# Patient Record
Sex: Male | Born: 1965
Health system: Southern US, Community
[De-identification: ages and names within clinical notes are randomized; demographics above are authoritative.]

## PROBLEM LIST (undated history)

## (undated) HISTORY — PX: NO PAST SURGERIES: SHX2092

---

## 2012-02-29 ENCOUNTER — Ambulatory Visit: Payer: Self-pay | Admitting: Internal Medicine

## 2019-11-12 ENCOUNTER — Encounter: Payer: Self-pay | Admitting: Emergency Medicine

## 2019-11-12 ENCOUNTER — Emergency Department
Admission: EM | Admit: 2019-11-12 | Discharge: 2019-11-12 | Disposition: A | Discharge status: Home / Self Care | Payer: BC Managed Care – PPO | Attending: Student | Admitting: Student

## 2019-11-12 ENCOUNTER — Emergency Department: Payer: BC Managed Care – PPO

## 2019-11-12 ENCOUNTER — Other Ambulatory Visit: Payer: Self-pay

## 2019-11-12 ENCOUNTER — Ambulatory Visit (INDEPENDENT_AMBULATORY_CARE_PROVIDER_SITE_OTHER)
Admission: EM | Admit: 2019-11-12 | Discharge: 2019-11-12 | Disposition: A | Discharge status: Home / Self Care | Payer: BC Managed Care – PPO | Source: Home / Self Care | Attending: Emergency Medicine | Admitting: Emergency Medicine

## 2019-11-12 DIAGNOSIS — G510 Bell's palsy: Secondary | ICD-10-CM | POA: Insufficient documentation

## 2019-11-12 DIAGNOSIS — H7292 Unspecified perforation of tympanic membrane, left ear: Secondary | ICD-10-CM | POA: Insufficient documentation

## 2019-11-12 DIAGNOSIS — H60392 Other infective otitis externa, left ear: Secondary | ICD-10-CM | POA: Diagnosis not present

## 2019-11-12 DIAGNOSIS — R29810 Facial weakness: Secondary | ICD-10-CM | POA: Diagnosis not present

## 2019-11-12 LAB — BASIC METABOLIC PANEL
Anion gap: 9 (ref 5–15)
BUN: 14 mg/dL (ref 6–20)
CO2: 22 mmol/L (ref 22–32)
Calcium: 8.7 mg/dL — ABNORMAL LOW (ref 8.9–10.3)
Chloride: 104 mmol/L (ref 98–111)
Creatinine, Ser: 0.82 mg/dL (ref 0.61–1.24)
GFR calc Af Amer: >60 mL/min (ref >60)
GFR calc non Af Amer: >60 mL/min (ref >60)
Glucose, Bld: 207 mg/dL — ABNORMAL HIGH (ref 70–99)
Potassium: 3.6 mmol/L (ref 3.5–5.1)
Sodium: 135 mmol/L (ref 135–145)

## 2019-11-12 LAB — CBC
HCT: 43.4 % (ref 39.0–52.0)
Hemoglobin: 13.6 g/dL (ref 13.0–17.0)
MCH: 22.3 pg — ABNORMAL LOW (ref 26.0–34.0)
MCHC: 31.3 g/dL (ref 30.0–36.0)
MCV: 71.1 fL — ABNORMAL LOW (ref 80.0–100.0)
Platelets: 277 10*3/uL (ref 150–400)
RBC: 6.1 MIL/uL — ABNORMAL HIGH (ref 4.22–5.81)
RDW: 14.7 % (ref 11.5–15.5)
WBC: 5 10*3/uL (ref 4.0–10.5)
nRBC: 0 % (ref 0.0–0.2)

## 2019-11-12 MED ORDER — PREDNISONE 20 MG PO TABS: 60.0000 mg | ORAL_TABLET | Freq: Every day | ORAL | 0 refills | Status: AC

## 2019-11-12 MED ORDER — CIPROFLOXACIN-DEXAMETHASONE 0.3-0.1 % OT SUSP: 3.0000 [drp] | Freq: Two times a day (BID) | OTIC | 0 refills | Status: AC

## 2019-11-12 MED ORDER — VALACYCLOVIR HCL 1 G PO TABS: 1000.0000 mg | ORAL_TABLET | Freq: Three times a day (TID) | ORAL | 0 refills | Status: AC

## 2019-11-12 NOTE — ED Triage Notes (Signed)
FIRST NURSE NOTE-here for left facial numbness and tingling.  Sent from urgent care for CVA/bells palsy work up.  No weakness noted. VAN negative.  Sx started yesterday afternoon.

## 2019-11-12 NOTE — ED Notes (Signed)
Pt brought back to room 19. Was seen at urgent care and sent over for facial numbness - unable to blink left eye or raise left eyebrow. Blood has resulted and pt had his ct. Also talked to pt regarding needing a pcp as his bs was 207 and he is not diagnosed with diabetes.

## 2019-11-12 NOTE — ED Triage Notes (Addendum)
Pt arrived via POV from [facility] with reports of left side facial droop and numbness since yesterday around 3pm.  Per notes from [facility], [place] is suspected.  Pt is unable to close left eye. Pt states when he was brushing his teeth today water was coming out of his mouth.  Speech clear. Denies any numbness or tingling to arms or legs, ambulatory with steady gait.  Pt reports mild headache on the left as well. Denies blurred vision.

## 2019-11-12 NOTE — Discharge Instructions (Signed)
Thank you for letting us take care of you in the emergency department today.   New medications we have prescribed:  - Ciprodex - ear drops for the LEFT ear, for your ear drum rupture - Prednisone - steroids for the Bell's palsy - Valacyclovir - antiviral for the Bell's palsy  Use artifical tears (over the counter Visine) every hour while awake. Tape your eye shut at night for protection. At work be sure to wear protective glasses or goggles.   Please follow up with: - A primary care doctor to review your ER visit and follow up on your symptoms. We have given you information for one below. They should recheck your sugar at this visit.  - An ENT doctor - to follow up on your ear drum - An eye doctor - for monitoring of your eye due to the Bell's palsy  Information for all 3 of these doctors is below.  Please return to the ER for any new or worsening symptoms.

## 2019-11-12 NOTE — ED Provider Notes (Signed)
[facility] Emergency Department Provider Note  ____________________________________________   First MD Initiated Contact with Patient 11/12/19 1216     (approximate)  I have reviewed the triage vital signs and the nursing notes.  History  Chief Complaint Facial Droop and Numbness    HPI Roger Gamble is a 53 y.o. male w/ no significant medical history who presents for left sided facial droop, inability to close his eye on the L, inability to raise forehead on left, as well as decreased sensation to the left sided face. Symptoms started yesterday afternoon. He has no associated headache, visual changes, slurred speech, or extremity weakness, numbness, tingling. No history of similar symptoms. No tick exposure.   Aside, he also reports ~1-2 weeks of left ear pain. He has been attempting to clean out his ears with solution and with bulb suctioning. He denies any drainage or decreased hearing.    Past Medical Hx History reviewed. No pertinent past medical history.  Problem List There are no active problems to display for this patient.   Past Surgical Hx History reviewed. No pertinent surgical history.  Medications Prior to Admission medications   Not on File    Allergies Patient has no known allergies.  Family Hx Family History  Problem Relation Age of Onset  . Healthy Mother   . Cirrhosis Father     Social Hx Social History   Tobacco Use  . Smoking status: Never Smoker  . Smokeless tobacco: Never Used  Substance Use Topics  . Alcohol use: Yes  . Drug use: Not on file     Review of Systems  Constitutional: Negative for fever, chills. Eyes: Negative for visual changes. ENT: Negative for sore throat. Cardiovascular: Negative for chest pain. Respiratory: Negative for shortness of breath. Gastrointestinal: Negative for nausea, vomiting.  Genitourinary: Negative for dysuria. Musculoskeletal: Negative for leg swelling. Skin:  Negative for rash. Neurological: + facial droop   Physical Exam  Vital Signs: ED Triage Vitals  Enc Vitals Group     BP 11/12/19 1118 (!) 156/97     Pulse Rate 11/12/19 1118 67     Resp 11/12/19 1118 16     Temp 11/12/19 1118 99.1 F (37.3 C)     Temp Source 11/12/19 1118 Oral     SpO2 11/12/19 1118 97 %     Weight 11/12/19 1129 204 lb (92.5 kg)     Height 11/12/19 1129 5\' 9"  (1.753 m)     Head Circumference --      Peak Flow --      Pain Score 11/12/19 1128 6     Pain Loc --      Pain Edu? --      Excl. in [place]? --     Constitutional: Alert and oriented.  Head: Left sided facial droop, including inability to raise eyebrow on left. Unable to close left eye.  Eyes: Conjunctivae clear. Sclera anicteric. PERRL. EOMI. Cannot close eye on left.  Ears: R TM clear, normal light reflex, no effusion. L TM perforated with erythema and irritation of the EAC. Nose: No congestion. No rhinorrhea. Mouth/Throat: Wearing mask.  Neck: No stridor.   Cardiovascular: Normal rate, regular rhythm. Extremities well perfused. Respiratory: Normal respiratory effort.  Lungs CTAB. Gastrointestinal: Soft. Non-tender. Non-distended.  Musculoskeletal: No lower extremity edema. No deformities. Neurologic:  Normal speech and language. No dysarthria. Left sided CN VII palsy, including inability to forehead raise or close eye on the left. Decreased sensation to L sided face  compared to R. UE and LE strength 5/5 and symmetric. SILT. Skin: Skin is warm, dry and intact. No rash noted. Psychiatric: Mood and affect are appropriate for situation.  EKG  N/A    Radiology  CT head: IMPRESSION:  No CT evidence of acute infarction. No acute intracranial findings.    Procedures  Procedure(s) performed (including critical care):  Procedures   Initial Impression / Assessment and Plan / ED Course  53 y.o. male who presents to the ED for left sided facial droop, CN VII palsy, as above. Symptoms since  yesterday afternoon.  Exam consistent with Bell's palsy. CT head negative. Labs w/o actionable derangements (incidentally noted hyperglycemia w/o evidence of DKA - advised PCP establishment and follow up). Exam also notable for perforated TM on left and otitis externa.   Will provide Rx for antiviral, prednisone for Bell's as well as f/u with ophthalmology. Discussed eye protection, lubrication, nightly taping.   Rx for Ciprodex drops for ear and ENT follow up.   Follow up with PCP as above. Patient and family comfortable w/ plan. Discussed return precautions.    Final Clinical Impression(s) / ED Diagnosis  Final diagnoses:  Bell's palsy  Tympanic membrane rupture, left  Acute infective otitis externa, left       Note:  This document was prepared using Dragon voice recognition software and may include unintentional dictation errors.   Lilia Pro., MD 11/12/19 [phone]

## 2019-11-12 NOTE — Discharge Instructions (Addendum)
Go directly to emergency room.  

## 2019-11-12 NOTE — ED Triage Notes (Signed)
Patient c/o left sided facial swelling, droop and numbness that started yesterday.  Patient c/o left ear pain for the past 2-3 days.  Patient denies fevers.

## 2019-11-12 NOTE — ED Provider Notes (Signed)
[facility] ____________________________________________  Time seen: Approximately 10:26 AM  I have reviewed the triage vital signs and the nursing notes.   HISTORY  Chief Complaint Facial Swelling (left side), Numbness (left side), and Facial Droop (left side)  HPI Roger Gamble is a 53 y.o. male presenting for evaluation of left facial droop and left facial numbness.  Reports this started yesterday afternoon, but reports the numbness continued to progress through the night last night.  Denies any acute changes since last night.  States this morning while brushing his teeth the water was coming out of the mouth on the left side prompted him to come in.  States some left-sided headache and ear discomfort.  Denies any injury, rash, vision changes, vision loss, confusion, paresthesias, unilateral weakness syncope or near syncope.  No chest pain, shortness of breath or recent sickness.  Denies any history of similar.  Denies family history of stroke or cardiac disease.  Denies smoking.   History reviewed. No pertinent past medical history. Denies  There are no active problems to display for this patient.   History reviewed. No pertinent surgical history.   No current facility-administered medications for this encounter.  No current outpatient medications on file.  Allergies Patient has no known allergies.  Family History  Problem Relation Age of Onset  . Healthy Mother   . Cirrhosis Father     Social History Social History   Tobacco Use  . Smoking status: Never Smoker  . Smokeless tobacco: Never Used  Substance Use Topics  . Alcohol use: Yes  . Drug use: Not on file    Review of Systems Constitutional: No fever/chills Eyes: No visual changes.  Reports tearing from bilateral eyes. ENT: No sore throat. Cardiovascular: Denies chest pain. Respiratory: Denies shortness of breath. Gastrointestinal: No abdominal pain.   Genitourinary: Negative for dysuria.  Musculoskeletal: Negative for back pain. Skin: Negative for rash. Neurological: Positive for facial numbness, droop and headache.  Negative for confusion, paresthesias or unsteady gait.  ____________________________________________   PHYSICAL EXAM:  VITAL SIGNS: ED Triage Vitals  Enc Vitals Group     BP 11/12/19 1002 (!) 146/96     Pulse Rate 11/12/19 1002 66     Resp 11/12/19 1002 16     Temp 11/12/19 1002 98.6 F (37 C)     Temp Source 11/12/19 1002 Oral     SpO2 11/12/19 1002 99 %     Weight 11/12/19 1002 204 lb (92.5 kg)     Height 11/12/19 1002 5\' 9"  (1.753 m)     Head Circumference --      Peak Flow --      Pain Score 11/12/19 1001 6     Pain Loc --      Pain Edu? --      Excl. in [place]? --     Constitutional: Alert and oriented. Well appearing and in no acute distress. Eyes: Conjunctivae are normal. PERRL. EOMI. difficulty fully closing left eye. ENT      Head: Normocephalic and atraumatic.      Ears: Nontender, normal canal, no rash, no erythema, normal TMs bilaterally.      Nose: No congestion/rhinnorhea.      Mouth/Throat: Mucous membranes are moist.Oropharynx non-erythematous. Neck: No stridor. Supple without meningismus.  Hematological/Lymphatic/Immunilogical: No cervical lymphadenopathy. Cardiovascular: Normal rate, regular rhythm. Grossly normal heart sounds.  Good peripheral circulation. Respiratory: Normal respiratory effort without tachypnea nor retractions. Breath sounds are clear and equal bilaterally. No wheezes, rales, rhonchi. Musculoskeletal:  Steady gait Neurologic: Mild left facial droop, mild eye lid lag to left eye.  Paresthesias to left forehead cheek and upper neck.  No other paresthesia noted.  No gait instability.5/5 strength to bilateral upper and lower extremities.  No ataxia.  Negative pronator drift. Skin:  Skin is warm, dry and intact. No rash noted. Psychiatric: Mood and affect are normal. Speech and behavior are normal. Patient exhibits  appropriate insight and judgment   ___________________________________________   LABS (all labs ordered are listed, but only abnormal results are displayed)  Labs Reviewed - No data to display ____________________________________________  EKG  ED ECG REPORT I, Renford Dills, the attending provider, personally viewed and interpreted this ECG.   Date: 11/12/2019  EKG Time: 1019  Rate: 60  Rhythm: Normal sinus rhythm  Axis: Normal  Intervals:none  ST&T Change: none  RADIOLOGY  No results found. ____________________________________________   PROCEDURES Procedures    INITIAL IMPRESSION / ASSESSMENT AND PLAN / ED COURSE  Pertinent labs & imaging results that were available during my care of the patient were reviewed by me and considered in my medical decision making (see chart for details).  Alert and oriented.  Well-appearing patient.  Acute onset of left facial numbness and droop yesterday afternoon around 3 PM.  Discussed multiple differentials with patient including CVA, TIA and Bell's palsy. Suspect Bell's palsy.  However recommend further evaluation emergency room at this time.  Patient states his wife will take him directly to [facility].  Patient stable at discharge.  ____________________________________________   FINAL CLINICAL IMPRESSION(S) / ED DIAGNOSES  Final diagnoses:  Facial droop     ED Discharge Orders    None       Note: This dictation was prepared with Dragon dictation along with smaller phrase technology. Any transcriptional errors that result from this process are unintentional.         Renford Dills, NP 11/12/19 1052

## 2019-11-28 DIAGNOSIS — H9202 Otalgia, left ear: Secondary | ICD-10-CM | POA: Diagnosis not present

## 2019-11-28 DIAGNOSIS — G510 Bell's palsy: Secondary | ICD-10-CM | POA: Diagnosis not present

## 2019-12-05 NOTE — Progress Notes (Signed)
Patient: Roger Gamble, Male    DOB: [birth year], 53 y.o.   MRN: [id number] Visit Date: 12/05/2019  Today's Provider: Jairo Ben, FNP   Chief Complaint  Patient presents with  . Establish Care   Subjective:    New Patient Roger Gamble is a 53 y.o. male who presents today for health maintenance and establish care. He feels fairly well, patient states that he would like to discuss today recent ED visit 11/12/19, patient states that he was diagnosed with bells palsy. Patient states that when he was in ED he was told that his blood sugar reading was >200 and patient would like to discuss . He reports he is not exercising . He reports he is sleeping well.  Patient is recovering from Bell's palsy, he reports he is taking day 7 of prednisone today.  He has had return of his forehead movement, and able to raise his forehead and close his left eye at this time as he was not previously able to in the emergency room.  CT scan in the emergency room was negative on 11/12/2019.  He was also diagnosed with a left perforated tympanic membrane after history of cleaning his ears out at home.  He denies any hearing loss or pain at today's visit.  He is here to establish care, we will do labs today and follow-up in 1 month for further discussion on lab results and healthcare health maintenance.  He is advised that he is due for colonoscopy. Sees dentist regularly.  No eye exam.  Patient  denies any fever, body aches,chills, rash, chest pain, shortness of breath, nausea, vomiting, or diarrhea.  He denies any rash, he denies any other concerns at today's visit.  -----------------------------------------------------------------   Review of Systems  Constitutional: Negative.   HENT: Negative.   Respiratory: Negative.   Cardiovascular: Negative.   Gastrointestinal: Negative.   Genitourinary: Negative.   Musculoskeletal: Negative.   Skin: Negative.   Neurological: Positive for  numbness. Negative for dizziness, tremors, seizures, syncope, facial asymmetry, speech difficulty, weakness, light-headedness and headaches.  Hematological: Negative for adenopathy. Does not bruise/bleed easily.  Psychiatric/Behavioral: Negative.   All other systems reviewed and are negative.   Social History He  reports that he has never smoked. He has never used smokeless tobacco. He reports current alcohol use. Social History   Socioeconomic History  . Marital status: Married    Spouse name: Not on file  . Number of children: Not on file  . Years of education: Not on file  . Highest education level: Not on file  Occupational History  . Not on file  Tobacco Use  . Smoking status: Never Smoker  . Smokeless tobacco: Never Used  Substance and Sexual Activity  . Alcohol use: Yes  . Drug use: Not on file  . Sexual activity: Not on file  Other Topics Concern  . Not on file  Social History Narrative  . Not on file   Social Determinants of Health   Financial Resource Strain:   . Difficulty of Paying Living Expenses: Not on file  Food Insecurity:   . Worried About Programme researcher, broadcasting/film/video in the Last Year: Not on file  . Ran Out of Food in the Last Year: Not on file  Transportation Needs:   . Lack of Transportation (Medical): Not on file  . Lack of Transportation (Non-Medical): Not on file  Physical Activity:   . Days of Exercise per Week: Not on  file  . Minutes of Exercise per Session: Not on file  Stress:   . Feeling of Stress : Not on file  Social Connections:   . Frequency of Communication with Friends and Family: Not on file  . Frequency of Social Gatherings with Friends and Family: Not on file  . Attends Religious Services: Not on file  . Active Member of Clubs or Organizations: Not on file  . Attends Archivist Meetings: Not on file  . Marital Status: Not on file    There are no problems to display for this patient.   No past surgical history on  file.  Family History  Family Status  Relation Name Status  . Mother  Alive  . Father  Deceased   His family history includes Cirrhosis in his father; Healthy in his mother.     No Known Allergies  Previous Medications   No medications on file    Patient Care Team: Patient, No Pcp Per as PCP - General (General Practice)      Objective:   Vitals: There were no vitals taken for this visit.   Physical Exam Vitals and nursing note reviewed.  Constitutional:      General: He is not in acute distress.    Appearance: Normal appearance. He is well-developed. He is not ill-appearing, toxic-appearing or diaphoretic.     Comments: Patient is alert and oriented and responsive to questions Engages in eye contact with provider. Speaks in full sentences without any pauses without any shortness of breath or distress.    HENT:     Head: Normocephalic and atraumatic.     Jaw: There is normal jaw occlusion.     Salivary Glands: Right salivary gland is not diffusely enlarged. Left salivary gland is not diffusely enlarged.     Comments: bilateral forehead movement is normal. Able to close and open bilateral eyes.     Right Ear: Hearing, tympanic membrane, ear canal and external ear normal.     Left Ear: Hearing, tympanic membrane, ear canal and external ear normal.     Nose: Nose normal.     Mouth/Throat:     Mouth: Mucous membranes are moist.     Pharynx: Uvula midline. No oropharyngeal exudate or posterior oropharyngeal erythema.  Eyes:     General: Lids are normal. Lids are everted, no foreign bodies appreciated. Gaze aligned appropriately. No visual field deficit or scleral icterus.       Right eye: No discharge.        Left eye: No discharge.     Extraocular Movements: Extraocular movements intact.     Conjunctiva/sclera: Conjunctivae normal.     Pupils: Pupils are equal, round, and reactive to light.  Neck:     Thyroid: No thyromegaly.     Vascular: Normal carotid pulses. No  carotid bruit, hepatojugular reflux or JVD.     Trachea: Trachea and phonation normal. No tracheal tenderness or tracheal deviation.     Meningeal: Brudzinski's sign absent.  Cardiovascular:     Rate and Rhythm: Normal rate and regular rhythm.     Pulses: Normal pulses.     Heart sounds: Normal heart sounds, S1 normal and S2 normal. Heart sounds not distant. No murmur. No friction rub. No gallop.   Pulmonary:     Effort: Pulmonary effort is normal. No accessory muscle usage or respiratory distress.     Breath sounds: Normal breath sounds. No stridor. No wheezing or rales.  Chest:  Chest wall: No tenderness.  Abdominal:     General: Bowel sounds are normal. There is no distension.     Palpations: Abdomen is soft. There is no mass.     Tenderness: There is no abdominal tenderness. There is no guarding or rebound.     Hernia: No hernia is present.  Genitourinary:    Comments: deferred by patient.  Musculoskeletal:        General: No tenderness or deformity. Normal range of motion.     Cervical back: Full passive range of motion without pain, normal range of motion and neck supple.     Comments: Patient moves on and off of exam table and in room without difficulty. Gait is normal in hall and in room. Patient is oriented to person place time and situation. Patient answers questions appropriately and engages in conversation.   Lymphadenopathy:     Head:     Right side of head: No submental, submandibular, tonsillar, preauricular, posterior auricular or occipital adenopathy.     Left side of head: No submental, submandibular, tonsillar, preauricular, posterior auricular or occipital adenopathy.     Cervical: No cervical adenopathy.  Skin:    General: Skin is warm and dry.     Capillary Refill: Capillary refill takes less than 2 seconds.     Coloration: Skin is not pale.     Findings: No erythema ( ) or rash.     Nails: There is no clubbing.  Neurological:     Mental Status: He is  alert and oriented to person, place, and time.     GCS: GCS eye subscore is 4. GCS verbal subscore is 5. GCS motor subscore is 6.     Cranial Nerves: No cranial nerve deficit or facial asymmetry.     Sensory: Sensory deficit (mild decrease in sensation to left side when compared to right side( patient reports much improved since ER visit) ) present.     Motor: Motor function is intact. No abnormal muscle tone.     Coordination: Coordination is intact. Coordination normal.     Gait: Gait is intact. Gait normal.     Deep Tendon Reflexes: Reflexes are normal and symmetric. Reflexes normal.     Reflex Scores:      Tricep reflexes are 2+ on the right side and 2+ on the left side.      Bicep reflexes are 2+ on the right side and 2+ on the left side.      Patellar reflexes are 2+ on the right side and 2+ on the left side. Psychiatric:        Attention and Perception: Attention and perception normal.        Mood and Affect: Mood and affect normal.        Speech: Speech normal.        Behavior: Behavior normal. Behavior is cooperative.        Thought Content: Thought content normal.        Cognition and Memory: Cognition normal.        Judgment: Judgment normal.      Depression Screen No flowsheet data found.    Assessment & Plan:     Routine Health Maintenance and Physical Exam  Exercise Activities and Dietary recommendations Goals   None      There is no immunization history on file for this patient.  There are no preventive care reminders to display for this patient.   Discussed health benefits of physical activity, and  encouraged him to engage in regular exercise appropriate for his age and condition.     1. Bell's palsy Continue current treatment plan. Discussed RED flags and when to seek immediate care.   - CBC with Differential/Platelet 005009 - Comprehensive Metabolic Panel (CMET) - VITAMIN D 25 Hydroxy (Vit-D Deficiency, Fractures) - TSH - PSA - HgB A1c -  Ambulatory referral to Gastroenterology  2. Routine health maintenance Labs today. Follow up in one month.  - CBC with Differential/Platelet 005009 - Comprehensive Metabolic Panel (CMET) - VITAMIN D 25 Hydroxy (Vit-D Deficiency, Fractures) - TSH - PSA - HgB A1c - Ambulatory referral to Gastroenterology  3. Blood glucose elevated Elevated in ER , had eaten prior. Will verify fasting today and A1C.  - CBC with Differential/Platelet 005009 - Comprehensive Metabolic Panel (CMET) - VITAMIN D 25 Hydroxy (Vit-D Deficiency, Fractures) - TSH - PSA - HgB A1c - Ambulatory referral to Gastroenterology- For Colonoscopy. No previous.    4. BMI 30.99  Discussed healthy lifestyle/ diet/ exercise.   Declines HIV screening, TDAP and Influenza at today's visit.   Will call with lab results.  Return to office sooner should any symptoms change or worsen.  Return in about 1 month (around 01/06/2020), or if symptoms worsen or fail to improve, for at any time for any worsening symptoms, Go to Emergency room/ urgent care if worse.  -------------------------------------------------------------------- .The entirety of the information documented in the History of Present Illness, Review of Systems and Physical Exam were personally obtained by me. Portions of this information were initially documented by the  Certified Medical Assistant whose name is documented in Epic and reviewed by me for thoroughness and accuracy.  I have personally performed the exam and reviewed the chart and it is accurate to the best of my knowledge.  Museum/gallery conservatorDragon technology has been used and any errors in dictation or transcription are unintentional.  Eula FriedMichelle S. Flinchum FNP-C  [facility]

## 2019-12-06 ENCOUNTER — Other Ambulatory Visit: Payer: Self-pay

## 2019-12-06 ENCOUNTER — Ambulatory Visit: Payer: BC Managed Care – PPO | Admitting: Adult Health

## 2019-12-06 ENCOUNTER — Encounter: Payer: Self-pay | Admitting: Adult Health

## 2019-12-06 VITALS — BP 126/80 | HR 70 | Temp 96.8°F | Resp 16 | Ht 67.5 in | Wt 200.8 lb

## 2019-12-06 DIAGNOSIS — G510 Bell's palsy: Secondary | ICD-10-CM

## 2019-12-06 DIAGNOSIS — R739 Hyperglycemia, unspecified: Secondary | ICD-10-CM | POA: Diagnosis not present

## 2019-12-06 DIAGNOSIS — Z0000 Encounter for general adult medical examination without abnormal findings: Secondary | ICD-10-CM | POA: Diagnosis not present

## 2019-12-06 DIAGNOSIS — R718 Other abnormality of red blood cells: Secondary | ICD-10-CM | POA: Diagnosis not present

## 2019-12-06 DIAGNOSIS — Z6830 Body mass index (BMI) 30.0-30.9, adult: Secondary | ICD-10-CM | POA: Diagnosis not present

## 2019-12-06 NOTE — Patient Instructions (Signed)
Health Maintenance, Male Adopting a healthy lifestyle and getting preventive care are important in promoting health and wellness. Ask your health care provider about:  The right schedule for you to have regular tests and exams.  Things you can do on your own to prevent diseases and keep yourself healthy. What should I know about diet, weight, and exercise? Eat a healthy diet   Eat a diet that includes plenty of vegetables, fruits, low-fat dairy products, and lean protein.  Do not eat a lot of foods that are high in solid fats, added sugars, or sodium. Maintain a healthy weight Body mass index (BMI) is a measurement that can be used to identify possible weight problems. It estimates body fat based on height and weight. Your health care provider can help determine your BMI and help you achieve or maintain a healthy weight. Get regular exercise Get regular exercise. This is one of the most important things you can do for your health. Most adults should:  Exercise for at least 150 minutes each week. The exercise should increase your heart rate and make you sweat (moderate-intensity exercise).  Do strengthening exercises at least twice a week. This is in addition to the moderate-intensity exercise.  Spend less time sitting. Even light physical activity can be beneficial. Watch cholesterol and blood lipids Have your blood tested for lipids and cholesterol at 51 years of age, then have this test every 5 years. You may need to have your cholesterol levels checked more often if:  Your lipid or cholesterol levels are high.  You are older than 51 years of age.  You are at high risk for heart disease. What should I know about cancer screening? Many types of cancers can be detected early and may often be prevented. Depending on your health history and family history, you may need to have cancer screening at various ages. This may include screening for:  Colorectal cancer.  Prostate  cancer.  Skin cancer.  Lung cancer. What should I know about heart disease, diabetes, and high blood pressure? Blood pressure and heart disease  High blood pressure causes heart disease and increases the risk of stroke. This is more likely to develop in people who have high blood pressure readings, are of African descent, or are overweight.  Talk with your health care provider about your target blood pressure readings.  Have your blood pressure checked: ? Every 3-5 years if you are 18-39 years of age. ? Every year if you are 40 years old or older.  If you are between the ages of 65 and 75 and are a current or former smoker, ask your health care provider if you should have a one-time screening for abdominal aortic aneurysm (AAA). Diabetes Have regular diabetes screenings. This checks your fasting blood sugar level. Have the screening done:  Once every three years after age 45 if you are at a normal weight and have a low risk for diabetes.  More often and at a younger age if you are overweight or have a high risk for diabetes. What should I know about preventing infection? Hepatitis B If you have a higher risk for hepatitis B, you should be screened for this virus. Talk with your health care provider to find out if you are at risk for hepatitis B infection. Hepatitis C Blood testing is recommended for:  Everyone born from 1945 through 1965.  Anyone with known risk factors for hepatitis C. Sexually transmitted infections (STIs)  You should be screened each year   for STIs, including gonorrhea and chlamydia, if: ? You are sexually active and are younger than 51 years of age. ? You are older than 51 years of age and your health care provider tells you that you are at risk for this type of infection. ? Your sexual activity has changed since you were last screened, and you are at increased risk for chlamydia or gonorrhea. Ask your health care provider if you are at risk.  Ask your  health care provider about whether you are at high risk for HIV. Your health care provider may recommend a prescription medicine to help prevent HIV infection. If you choose to take medicine to prevent HIV, you should first get tested for HIV. You should then be tested every 3 months for as long as you are taking the medicine. Follow these instructions at home: Lifestyle  Do not use any products that contain nicotine or tobacco, such as cigarettes, e-cigarettes, and chewing tobacco. If you need help quitting, ask your health care provider.  Do not use street drugs.  Do not share needles.  Ask your health care provider for help if you need support or information about quitting drugs. Alcohol use  Do not drink alcohol if your health care provider tells you not to drink.  If you drink alcohol: ? Limit how much you have to 0-2 drinks a day. ? Be aware of how much alcohol is in your drink. In the [place] one drink equals one 12 oz bottle of beer (355 mL), one 5 oz glass of wine (148 mL), or one 1 oz glass of hard liquor (44 mL). General instructions  Schedule regular health, dental, and eye exams.  Stay current with your vaccines.  Tell your health care provider if: ? You often feel depressed. ? You have ever been abused or do not feel safe at home. Summary  Adopting a healthy lifestyle and getting preventive care are important in promoting health and wellness.  Follow your health care provider's instructions about healthy diet, exercising, and getting tested or screened for diseases.  Follow your health care provider's instructions on monitoring your cholesterol and blood pressure. This information is not intended to replace advice given to you by your health care provider. Make sure you discuss any questions you have with your health care provider. Document Released: 05/22/2008 Document Revised: 11/17/2018 Document Reviewed: 11/17/2018 Elsevier Patient Education  2020 [employer] Bell Palsy, Adult  Bell palsy is a short-term inability to move muscles in part of the face. The inability to move (paralysis) results from inflammation or compression of the facial nerve, which travels along the skull and under the ear to the side of the face (7th cranial nerve). This nerve is responsible for facial movements that include blinking, closing the eyes, smiling, and frowning. What are the causes? The exact cause of this condition is not known. It may be caused by an infection from a virus, such as the chickenpox (herpes zoster), Epstein-Barr, or mumps virus. What increases the risk? You are more likely to develop this condition if:  You are pregnant.  You have diabetes.  You have had a recent infection in your nose, throat, or airways (upper respiratory infection).  You have a weakened body defense system (immune system).  You have had a facial injury, such as a fracture.  You have a family history of Bell palsy. What are the signs or symptoms? Symptoms of this condition include:  Weakness on one side of the face.  Drooping eyelid and corner of the mouth.  Excessive tearing in one eye.  Difficulty closing the eyelid.  Dry eye.  Drooling.  Dry mouth.  Changes in taste.  Change in facial appearance.  Pain behind one ear.  Ringing in one or both ears.  Sensitivity to sound in one ear.  Facial twitching.  Headache.  Impaired speech.  Dizziness.  Difficulty eating or drinking. Most of the time, only one side of the face is affected. Rarely, Bell palsy affects the whole face. How is this diagnosed? This condition is diagnosed based on:  Your symptoms.  Your medical history.  A physical exam. You may also have to see health care providers who specialize in disorders of the nerves (neurologist) or diseases and conditions of the eye (ophthalmologist). You may have tests, such as:  A test to check for nerve damage  (electromyogram).  Imaging studies, such as CT or MRI scans.  Blood tests. How is this treated? This condition affects every person differently. Sometimes symptoms go away without treatment within a couple weeks. If treatment is needed, it varies from person to person. The goal of treatment is to reduce inflammation and protect the eye from damage. Treatment for Bell palsy may include:  Medicines, such as: ? Steroids to reduce swelling and inflammation. ? Antiviral drugs. ? Pain relievers, including aspirin, acetaminophen, or ibuprofen.  Eye drops or ointment to keep your eye moist.  Eye protection, if you cannot close your eye.  Exercises or massage to regain muscle strength and function (physical therapy). Follow these instructions at home:   Take over-the-counter and prescription medicines only as told by your health care provider.  If your eye is affected: ? Keep your eye moist with eye drops or ointment as told by your health care provider. ? Follow instructions for eye care and protection as told by your health care provider.  Do any physical therapy exercises as told by your health care provider.  Keep all follow-up visits as told by your health care provider. This is important. Contact a health care provider if:  You have a fever.  Your symptoms do not get better within 2-3 weeks, or your symptoms get worse.  Your eye is red, irritated, or painful.  You have new symptoms. Get help right away if:  You have weakness or numbness in a part of your body other than your face.  You have trouble swallowing.  You develop neck pain or stiffness.  You develop dizziness or shortness of breath. Summary  Bell palsy is a short-term inability to move muscles in part of the face. The inability to move (paralysis) results from inflammation or compression of the facial nerve.  This condition affects every person differently. Sometimes symptoms go away without treatment within  a couple weeks.  If treatment is needed, it varies from person to person. The goal of treatment is to reduce inflammation and protect the eye from damage.  Contact your health care provider if your symptoms do not get better within 2-3 weeks, or your symptoms get worse. This information is not intended to replace advice given to you by your health care provider. Make sure you discuss any questions you have with your health care provider. Document Released: 11/24/2005 Document Revised: 11/06/2017 Document Reviewed: 01/27/2017 Elsevier Patient Education  2020 [employer].

## 2019-12-07 LAB — COMPREHENSIVE METABOLIC PANEL
ALT: 16 IU/L (ref 0–44)
AST: 6 IU/L (ref 0–40)
Albumin/Globulin Ratio: 1.8 (ref 1.2–2.2)
Albumin: 4.5 g/dL (ref 3.8–4.9)
Alkaline Phosphatase: 63 IU/L (ref 39–117)
BUN/Creatinine Ratio: 26 — ABNORMAL HIGH (ref 9–20)
BUN: 20 mg/dL (ref 6–24)
Bilirubin Total: 0.6 mg/dL (ref 0.0–1.2)
CO2: 23 mmol/L (ref 20–29)
Calcium: 9.6 mg/dL (ref 8.7–10.2)
Chloride: 103 mmol/L (ref 96–106)
Creatinine, Ser: 0.77 mg/dL (ref 0.76–1.27)
GFR calc Af Amer: 121 mL/min/{1.73_m2} (ref >59)
GFR calc non Af Amer: 105 mL/min/{1.73_m2} (ref >59)
Globulin, Total: 2.5 g/dL (ref 1.5–4.5)
Glucose: 111 mg/dL — ABNORMAL HIGH (ref 65–99)
Potassium: 4.5 mmol/L (ref 3.5–5.2)
Sodium: 139 mmol/L (ref 134–144)
Total Protein: 7 g/dL (ref 6.0–8.5)

## 2019-12-07 LAB — CBC WITH DIFFERENTIAL/PLATELET
Basophils Absolute: 0 10*3/uL (ref 0.0–0.2)
Basos: 0 %
EOS (ABSOLUTE): 0 10*3/uL (ref 0.0–0.4)
Eos: 0 %
Hematocrit: 43.7 % (ref 37.5–51.0)
Hemoglobin: 13.8 g/dL (ref 13.0–17.7)
Immature Grans (Abs): 0.1 10*3/uL (ref 0.0–0.1)
Immature Granulocytes: 1 %
Lymphocytes Absolute: 1.1 10*3/uL (ref 0.7–3.1)
Lymphs: 13 %
MCH: 22.5 pg — ABNORMAL LOW (ref 26.6–33.0)
MCHC: 31.6 g/dL (ref 31.5–35.7)
MCV: 71 fL — ABNORMAL LOW (ref 79–97)
Monocytes Absolute: 0.4 10*3/uL (ref 0.1–0.9)
Monocytes: 5 %
Neutrophils Absolute: 7.1 10*3/uL — ABNORMAL HIGH (ref 1.4–7.0)
Neutrophils: 81 %
Platelets: 368 10*3/uL (ref 150–450)
RBC: 6.13 x10E6/uL — ABNORMAL HIGH (ref 4.14–5.80)
RDW: 16.4 % — ABNORMAL HIGH (ref 11.6–15.4)
WBC: 8.8 10*3/uL (ref 3.4–10.8)

## 2019-12-07 LAB — PSA: Prostate Specific Ag, Serum: 3.5 ng/mL (ref 0.0–4.0)

## 2019-12-07 LAB — VITAMIN D 25 HYDROXY (VIT D DEFICIENCY, FRACTURES): Vit D, 25-Hydroxy: 9.7 ng/mL — ABNORMAL LOW (ref 30.0–100.0)

## 2019-12-07 LAB — HEMOGLOBIN A1C
Est. average glucose Bld gHb Est-mCnc: 134 mg/dL
Hgb A1c MFr Bld: 6.3 % — ABNORMAL HIGH (ref 4.8–5.6)

## 2019-12-07 LAB — TSH: TSH: 2.34 u[IU]/mL (ref 0.450–4.500)

## 2019-12-08 DIAGNOSIS — G510 Bell's palsy: Secondary | ICD-10-CM | POA: Diagnosis not present

## 2019-12-12 NOTE — Progress Notes (Signed)
Can you check on adding on a TIBC/ iron.  Low MCV diagnosis code.

## 2019-12-12 NOTE — Progress Notes (Signed)
Lab order has been faxed to [facility] to add additional lab. KW

## 2019-12-13 LAB — IRON AND TIBC
Iron Saturation: 32 % (ref 15–55)
Iron: 92 ug/dL (ref 38–169)
Total Iron Binding Capacity: 291 ug/dL (ref 250–450)
UIBC: 199 ug/dL (ref 111–343)

## 2019-12-13 LAB — SPECIMEN STATUS REPORT

## 2019-12-14 NOTE — Progress Notes (Signed)
Please inform patient he has some minor irregularities in his CBC, red blood cells mildly elevated. I would like to recheck the CBC in 2 months to be sure this has returned to normal. This could be related to a virus or possibly the Bells Palsy he had recently. Iron levels are normal.  Fasting glucose was mildly elevated at 111 normal is 65-99. Hemoglobin A1C is elevated at 6.3 putting him in the prediabetes range. If he is interested we can refer to case management for preventing diabetes diet. He should avoid/ strictly limit processed foods, white breads, foods and drinks high in sugar including candy, sweets to avoid diabetes. Increase exercise.  Prostate is normal. Thyroid level is normal.  Vitamin D is very low at 9.7. I will send in prescription to pharmacy of choice vitamin D to take 50,000 IU one tablet  once a week ( or every 7 days) by mouth for 8 weeks. Then he can buy over the counter Vitamin D 3 and take 2,000 international units by mouth daily.   Recheck Vitamin D, Hemoglobin A1C around 4 months.  Recheck CBC in 2 months- also check lipid panel at that time as no results for it.  Let me know if any questions and if he wants the Vitamin D

## 2019-12-16 ENCOUNTER — Encounter: Payer: Self-pay | Admitting: *Deleted

## 2019-12-19 DIAGNOSIS — G510 Bell's palsy: Secondary | ICD-10-CM | POA: Diagnosis not present

## 2019-12-21 ENCOUNTER — Telehealth: Payer: Self-pay | Admitting: Adult Health

## 2019-12-21 ENCOUNTER — Telehealth: Payer: Self-pay

## 2019-12-21 DIAGNOSIS — E559 Vitamin D deficiency, unspecified: Secondary | ICD-10-CM

## 2019-12-21 MED ORDER — VITAMIN D (ERGOCALCIFEROL) 1.25 MG (50000 UNIT) PO CAPS: 50000.0000 [IU] | ORAL_CAPSULE | ORAL | 0 refills | Status: DC

## 2019-12-21 NOTE — Telephone Encounter (Signed)
Please review lab and advise. KW

## 2019-12-21 NOTE — Telephone Encounter (Signed)
Meds ordered this encounter  Medications  . Vitamin D, Ergocalciferol, (DRISDOL) 1.25 MG (50000 UNIT) CAPS capsule    Sig: Take 1 capsule (50,000 Units total) by mouth every 7 (seven) days.    Dispense:  8 capsule    Refill:  0

## 2019-12-21 NOTE — Telephone Encounter (Signed)
Copied from CRM [id number]. Topic: General - Other >> Dec 21, 2019 11:46 AM Jaquita Rector A wrote: Reason for CRM: Patient wife Charlotte Sanes called to say that she was informed that an Rx for Vitamin D to be taken once weekly would had been sent to the pharmacy but upon checking with the pharmacy nothing was called in. Asking Dr Ashok Norris to please send the Rx for the Vitamin D in so that they can pick it up. Any questions please call Ph#   [phone]

## 2019-12-21 NOTE — Telephone Encounter (Signed)
Meds ordered this encounter  Medications   Vitamin D, Ergocalciferol, (DRISDOL) 1.25 MG (50000 UNIT) CAPS capsule    Sig: Take 1 capsule (50,000 Units total) by mouth every 7 (seven) days.    Dispense:  8 capsule    Refill:  0  Sent to pharmacy. After finishing script Vitamin D,start over the counter vitamin d 3 at 4,000 international units daily by mouth. Recheck vitamin D lab in 3 months.

## 2020-01-05 ENCOUNTER — Encounter: Payer: Self-pay | Admitting: Adult Health

## 2020-01-05 NOTE — Progress Notes (Signed)
Patient: Roger Gamble Male    DOB: 01-07-1966   54 y.o.   MRN: [id number] Visit Date: 01/06/2020  Today's Provider: Marcille Buffy, FNP   Chief Complaint  Patient presents with  . Follow-up   Subjective:   Prediabetes:  Diabetes He presents for his initial (pre diabetic teaching / review hemoglobin A1c ) diabetic visit. Diabetes type: PRE DIABETEC  No MedicAlert identification noted. Disease course: WILL RECHECK IN APRIL 2021. There are no hypoglycemic associated symptoms. Pertinent negatives for hypoglycemia include no dizziness, headaches, pallor, seizures, speech difficulty or tremors. Pertinent negatives for diabetes include no blurred vision, no chest pain, no fatigue, no foot paresthesias, no foot ulcerations, no polydipsia, no polyphagia, no polyuria, no visual change, no weakness and no weight loss. There are no hypoglycemic complications. Symptoms are stable. There are no diabetic complications. Risk factors for coronary artery disease include obesity and male sex. Current diabetic treatment includes diet. He is compliant with treatment some of the time. He is following a generally unhealthy diet. Meal planning includes ADA exchanges, carbohydrate counting and avoidance of concentrated sweets. He has not had a previous visit with a dietitian. He rarely participates in exercise. He does not see a podiatrist.Eye exam is not current.    Follow up for  Bells Palsy, pre- diabetic diet education  Patient returns to office today for follow up visit.   Bells Palsy has improved substantially, patient has increased movement in his forehead now he reports and his cheek, he still feels as if he has some numbness near the left side of his mouth and feels as if his smile is off a little when looking in the mirror that has slightly improved since last visit. He denies any worsening of symptoms. No slurred speech, difficulty with activities, new paresthesias, or headaches.  Overall  he feels he is doing much better.  He was initially seen in emergency room on 11/12/2019  CT of head was within normal limits he was discharged and treated with Valtrex and Prednisone 49 pill pack. He reports he has improved since visit with me on 12/05/2020.  He also is doing well with Vitamin D prescription, he was found to be very deficient on his labs at his initial visit with me on 12/05/2020.  Patient  denies any fever, body aches,chills, rash, chest pain, shortness of breath, nausea, vomiting, or diarrhea.   ------------------------------------------------------------------------------------  No Known Allergies   Current Outpatient Medications:  Marland Kitchen  Vitamin D, Ergocalciferol, (DRISDOL) 1.25 MG (50000 UNIT) CAPS capsule, Take 1 capsule (50,000 Units total) by mouth every 7 (seven) days., Disp: 8 capsule, Rfl: 0  Review of Systems  Constitutional: Negative for activity change, appetite change, chills, diaphoresis, fatigue, fever, unexpected weight change and weight loss.  HENT: Negative.   Eyes: Negative.  Negative for blurred vision.  Respiratory: Negative.  Negative for apnea, cough, choking, chest tightness, shortness of breath, wheezing and stridor.   Cardiovascular: Negative.  Negative for chest pain, palpitations and leg swelling.  Endocrine: Negative for polydipsia, polyphagia and polyuria.  Genitourinary: Negative.   Musculoskeletal: Negative.   Skin: Negative for color change, pallor, rash and wound.  Allergic/Immunologic: Negative.   Neurological: Positive for numbness (left side of mouth still feels a little numb since Bells Palsy. ). Negative for dizziness, tremors, seizures, syncope, facial asymmetry, speech difficulty, weakness, light-headedness and headaches.  Hematological: Negative for adenopathy. Does not bruise/bleed easily.  Psychiatric/Behavioral: Negative.     Social History  Tobacco Use  . Smoking status: Never Smoker  . Smokeless tobacco: Never Used    Substance Use Topics  . Alcohol use: Yes      Objective:   BP 116/78   Pulse 62   Temp (!) 96.6 F (35.9 C) (Oral)   Resp 15   Wt 193 lb 3.2 oz (87.6 kg)   SpO2 99%   BMI 29.81 kg/m  Vitals:   01/06/20 0807  BP: 116/78  Pulse: 62  Resp: 15  Temp: (!) 96.6 F (35.9 C)  TempSrc: Oral  SpO2: 99%  Weight: 193 lb 3.2 oz (87.6 kg)  Body mass index is 29.81 kg/m. temporal temperature not oral   Physical Exam Vitals reviewed.  Constitutional:      General: He is not in acute distress.    Appearance: Normal appearance. He is obese. He is not ill-appearing, toxic-appearing or diaphoretic.     Comments: Patient moves on and off of exam table and in room without difficulty. Gait is normal in hall and in room. Patient is oriented to person place time and situation. Patient answers questions appropriately and engages in conversation.   HENT:     Head: Normocephalic and atraumatic.     Right Ear: Tympanic membrane, ear canal and external ear normal. There is no impacted cerumen.     Left Ear: Tympanic membrane, ear canal and external ear normal. There is no impacted cerumen.     Nose: Nose normal.     Mouth/Throat:     Mouth: Mucous membranes are moist.     Pharynx: Oropharynx is clear. No oropharyngeal exudate or posterior oropharyngeal erythema.  Eyes:     General: No visual field deficit or scleral icterus.       Right eye: No discharge.        Left eye: No discharge.     Extraocular Movements: Extraocular movements intact.     Conjunctiva/sclera: Conjunctivae normal.     Pupils: Pupils are equal, round, and reactive to light.  Cardiovascular:     Rate and Rhythm: Normal rate and regular rhythm.     Pulses: Normal pulses.     Heart sounds: Normal heart sounds. No murmur. No friction rub. No gallop.   Pulmonary:     Effort: Pulmonary effort is normal. No respiratory distress.     Breath sounds: Normal breath sounds. No stridor. No wheezing, rhonchi or rales.  Chest:      Chest wall: No tenderness.  Abdominal:     Palpations: Abdomen is soft.  Musculoskeletal:        General: No swelling, tenderness, deformity or signs of injury. Normal range of motion.     Cervical back: Normal range of motion and neck supple.     Right lower leg: No edema.     Left lower leg: No edema.  Lymphadenopathy:     Cervical: No cervical adenopathy.  Skin:    General: Skin is warm and dry.     Capillary Refill: Capillary refill takes less than 2 seconds.     Coloration: Skin is not pale.     Findings: No bruising, erythema, lesion or rash.  Neurological:     General: No focal deficit present.     Mental Status: He is alert and oriented to person, place, and time. Mental status is at baseline.     GCS: GCS eye subscore is 4. GCS verbal subscore is 5. GCS motor subscore is 6.     Cranial Nerves: Facial  asymmetry (left sided smile slightly lower than right since Bells Palsy ) present. No cranial nerve deficit.     Sensory: Sensation is intact. No sensory deficit.     Motor: Motor function is intact. No weakness.     Coordination: Coordination is intact. Coordination normal.     Gait: Gait is intact. Gait normal.     Deep Tendon Reflexes: Reflexes normal.     Reflex Scores:      Tricep reflexes are 2+ on the right side and 2+ on the left side.      Bicep reflexes are 2+ on the right side and 2+ on the left side.      Brachioradialis reflexes are 2+ on the right side and 2+ on the left side.      Patellar reflexes are 2+ on the right side and 2+ on the left side.      Achilles reflexes are 2+ on the right side and 2+ on the left side.    Comments: Sensation of face is normal bilaterally.  Smile has substantially improved since last vist, no other neurological deficits. Forehead and cheek movement normal.  Strength upper and lower bilateral extremities 5/5. Sensation also normal in bilateral extremities.   Psychiatric:        Mood and Affect: Mood normal.        Behavior:  Behavior normal.        Thought Content: Thought content normal.        Judgment: Judgment normal.      No results found for any visits on 01/06/20.     Assessment & Plan      1. Abnormal CBC Will return around April to recheck fasting.  Discussed results with patient.  - CBC with Differential/Platelet  2. Vitamin D deficiency Will recheck around April.  - VITAMIN D 25 Hydroxy (Vit-D Deficiency, Fractures) Switch to Vitamin D 3 take 4,000 IU international units daily when prescription is gone.   3. Prediabetes Diet and education discussed. Spent at least 15 minutes counseling for prediabetes diet. Encourage increased exercise and healthy diet.  - HgB A1c  4. Screening for hyperlipidemia Lipid panel was not drawn at last visit. He will have done in April. Discussed healthy diet, low cholesterol, low saturated fat. He denies any hyperlipidemia in the past.   5. Facial Paresthesia- Mild and resolving / recent history of Bells Palsy.   Discussed returning if symptoms do not resolve within 1 month. Will give prednisone today. Has taken Prednisone and antiviral in december. Discussed emergent RED Flag symptoms and strict go to the emergency room precautions given him. Advised him no symptoms should worsen and that he should seek care immediately if that occurs. Discussed Bell's Palsy and course of etiology. Questions answered and patient verbalized understanding of plan.   Orders Placed This Encounter  Procedures  . VITAMIN D 25 Hydroxy (Vit-D Deficiency, Fractures)  . CBC with Differential/Platelet  . HgB A1c  . Lipid Panel w/o Chol/HDL Ratio     Meds ordered this encounter  Medications  . Vitamin D, Ergocalciferol, (DRISDOL) 1.25 MG (50000 UNIT) CAPS capsule    Sig: Take 1 capsule (50,000 Units total) by mouth every 7 (seven) days.    Dispense:  8 capsule    Refill:  0  . predniSONE (DELTASONE) 20 MG tablet    Sig: By mouth:Take 3 tablets(60mg ) for 5 days, take 2  tablets(40mg ) for 3 days, take 1(20mg ) tablet for 2 days, take 1/2 tablet (10mg ) for  2 days and discontinue.    Dispense:  21 tablet    Refill:  24  Reviewed medications above, side effects, risks versus benefit and patient desires and agrees with treatment plan.   Return in about 6 months (around 07/05/2020), or if symptoms worsen or fail to improve, for Go to Emergency room/ urgent care if worse, at any time for any worsening symptoms.   Advised patient call the office or your primary care doctor for an appointment if no improvement within 72 hours or if any symptoms change or worsen at any time  Advised ER or urgent Care if after hours or on weekend. Call 911 for emergency symptoms at any time.Patinet verbalized understanding of all instructions given/reviewed and treatment plan and has no further questions or concerns at this time.       The entirety of the information documented in the History of Present Illness, Review of Systems and Physical Exam were personally obtained by me. Portions of this information were initially documented by the  Certified Medical Assistant whose name is documented in Epic and reviewed by me for thoroughness and accuracy.  I have personally performed the exam and reviewed the chart and it is accurate to the best of my knowledge.  Museum/gallery conservator has been used and any errors in dictation or transcription are unintentional.  Eula Fried. Flinchum FNP-C  [facility]   Jairo Ben, FNP  [facility]

## 2020-01-06 ENCOUNTER — Other Ambulatory Visit: Payer: Self-pay

## 2020-01-06 ENCOUNTER — Ambulatory Visit: Payer: BC Managed Care – PPO | Admitting: Adult Health

## 2020-01-06 ENCOUNTER — Encounter: Payer: Self-pay | Admitting: Adult Health

## 2020-01-06 VITALS — BP 116/78 | HR 62 | Temp 96.6°F | Resp 15 | Wt 193.2 lb

## 2020-01-06 DIAGNOSIS — E559 Vitamin D deficiency, unspecified: Secondary | ICD-10-CM

## 2020-01-06 DIAGNOSIS — R7303 Prediabetes: Secondary | ICD-10-CM | POA: Diagnosis not present

## 2020-01-06 DIAGNOSIS — R7989 Other specified abnormal findings of blood chemistry: Secondary | ICD-10-CM | POA: Diagnosis not present

## 2020-01-06 DIAGNOSIS — Z13220 Encounter for screening for lipoid disorders: Secondary | ICD-10-CM

## 2020-01-06 DIAGNOSIS — Z8669 Personal history of other diseases of the nervous system and sense organs: Secondary | ICD-10-CM

## 2020-01-06 DIAGNOSIS — Z6829 Body mass index (BMI) 29.0-29.9, adult: Secondary | ICD-10-CM

## 2020-01-06 DIAGNOSIS — R202 Paresthesia of skin: Secondary | ICD-10-CM

## 2020-01-06 MED ORDER — PREDNISONE 20 MG PO TABS: ORAL_TABLET | ORAL | 24 refills | Status: DC

## 2020-01-06 MED ORDER — VITAMIN D (ERGOCALCIFEROL) 1.25 MG (50000 UNIT) PO CAPS: 50000.0000 [IU] | ORAL_CAPSULE | ORAL | 0 refills | Status: DC

## 2020-01-06 NOTE — Patient Instructions (Addendum)
Come to the office for labs around first of April. Lab orders are in and you can walk in Monday to Friday 8-3 pm - nothing to eat or drink 8 hours before labs except you can have black coffee and water.   Preventing Type 2 Diabetes Mellitus Type 2 diabetes (type 2 diabetes mellitus) is a long-term (chronic) disease that affects blood sugar (glucose) levels. Normally, a hormone called insulin allows glucose to enter cells in the body. The cells use glucose for energy. In type 2 diabetes, one or both of these problems may be present:  The body does not make enough insulin.  The body does not respond properly to insulin that it makes (insulin resistance). Insulin resistance or lack of insulin causes excess glucose to build up in the blood instead of going into cells. As a result, high blood glucose (hyperglycemia) develops, which can cause many complications. Being overweight or obese and having an inactive (sedentary) lifestyle can increase your risk for diabetes. Type 2 diabetes can be delayed or prevented by making certain nutrition and lifestyle changes. What nutrition changes can be made?   Eat healthy meals and snacks regularly. Keep a healthy snack with you for when you get hungry between meals, such as fruit or a handful of nuts.  Eat lean meats and proteins that are low in saturated fats, such as chicken, fish, egg whites, and beans. Avoid processed meats.  Eat plenty of fruits and vegetables and plenty of grains that have not been processed (whole grains). It is recommended that you eat: ? 1?2 cups of fruit every day. ? 2?3 cups of vegetables every day. ? 6?8 oz of whole grains every day, such as oats, whole wheat, bulgur, brown rice, quinoa, and millet.  Eat low-fat dairy products, such as milk, yogurt, and cheese.  Eat foods that contain healthy fats, such as nuts, avocado, olive oil, and canola oil.  Drink water throughout the day. Avoid drinks that contain added sugar, such as  soda or sweet tea.  Follow instructions from your health care provider about specific eating or drinking restrictions.  Control how much food you eat at a time (portion size). ? Check food labels to find out the serving sizes of foods. ? Use a kitchen scale to weigh amounts of foods.  Saute or steam food instead of frying it. Cook with water or broth instead of oils or butter.  Limit your intake of: ? Salt (sodium). Have no more than 1 tsp (2,400 mg) of sodium a day. If you have heart disease or high blood pressure, have less than ? tsp (1,500 mg) of sodium a day. ? Saturated fat. This is fat that is solid at room temperature, such as butter or fat on meat. What lifestyle changes can be made? Activity   Do moderate-intensity physical activity for at least 30 minutes on at least 5 days of the week, or as much as told by your health care provider.  Ask your health care provider what activities are safe for you. A mix of physical activities may be best, such as walking, swimming, cycling, and strength training.  Try to add physical activity into your day. For example: ? Park in spots that are farther away than usual, so that you walk more. For example, park in a far corner of the parking lot when you go to the office or the grocery store. ? Take a walk during your lunch break. ? Use stairs instead of elevators or escalators. Weight  Loss  Lose weight as directed. Your health care provider can determine how much weight loss is best for you and can help you lose weight safely.  If you are overweight or obese, you may be instructed to lose at least 5?7 % of your body weight. Alcohol and Tobacco   Limit alcohol intake to no more than 1 drink a day for nonpregnant women and 2 drinks a day for men. One drink equals 12 oz of beer, 5 oz of wine, or 1 oz of hard liquor.  Do not use any tobacco products, such as cigarettes, chewing tobacco, and e-cigarettes. If you need help quitting, ask  your health care provider. Work With [place] Provider  Have your blood glucose tested regularly, as told by your health care provider.  Discuss your risk factors and how you can reduce your risk for diabetes.  Get screening tests as told by your health care provider. You may have screening tests regularly, especially if you have certain risk factors for type 2 diabetes.  Make an appointment with a diet and nutrition specialist (registered dietitian). A registered dietitian can help you make a healthy eating plan and can help you understand portion sizes and food labels. Why are these changes important?  It is possible to prevent or delay type 2 diabetes and related health problems by making lifestyle and nutrition changes.  It can be difficult to recognize signs of type 2 diabetes. The best way to avoid possible damage to your body is to take actions to prevent the disease before you develop symptoms. What can happen if changes are not made?  Your blood glucose levels may keep increasing. Having high blood glucose for a long time is dangerous. Too much glucose in your blood can damage your blood vessels, heart, kidneys, nerves, and eyes.  You may develop prediabetes or type 2 diabetes. Type 2 diabetes can lead to many chronic health problems and complications, such as: ? Heart disease. ? Stroke. ? Blindness. ? Kidney disease. ? Depression. ? Poor circulation in the feet and legs, which could lead to surgical removal (amputation) in severe cases. Where to find support  Ask your health care provider to recommend a registered dietitian, diabetes educator, or weight loss program.  Look for local or online weight loss groups.  Join a gym, fitness club, or outdoor activity group, such as a walking club. Where to find more information To learn more about diabetes and diabetes prevention, visit:  American Diabetes Association (ADA): www.diabetes.[employer] of  Diabetes and Digestive and Kidney Diseases: FindSpin.nl To learn more about healthy eating, visit:  The U.S. Department of Agriculture Scientist, research (physical sciences)), Choose My Plate: http://wiley-williams.com/  Office of Disease Prevention and Health Promotion (ODPHP), Dietary Guidelines: SurferLive.at Summary  You can reduce your risk for type 2 diabetes by increasing your physical activity, eating healthy foods, and losing weight as directed.  Talk with your health care provider about your risk for type 2 diabetes. Ask about any blood tests or screening tests that you need to have. This information is not intended to replace advice given to you by your health care provider. Make sure you discuss any questions you have with your health care provider. Document Revised: 03/18/2019 Document Reviewed: 01/15/2016 Elsevier Patient Education  2020 [place]. Prediabetes Eating Plan Prediabetes is a condition that causes blood sugar (glucose) levels to be higher than normal. This increases the risk for developing diabetes. In order to prevent diabetes from developing, your health  care provider may recommend a diet and other lifestyle changes to help you:  Control your blood glucose levels.  Improve your cholesterol levels.  Manage your blood pressure. Your health care provider may recommend working with a diet and nutrition specialist (dietitian) to make a meal plan that is best for you. What are tips for following this plan? Lifestyle  Set weight loss goals with the help of your health care team. It is recommended that most people with prediabetes lose 7% of their current body weight.  Exercise for at least 30 minutes at least 5 days a week.  Attend a support group or seek ongoing support from a mental health counselor.  Take over-the-counter and prescription medicines only as told by your health care provider. Reading food labels  Read food  labels to check the amount of fat, salt (sodium), and sugar in prepackaged foods. Avoid foods that have: ? Saturated fats. ? Trans fats. ? Added sugars.  Avoid foods that have more than 300 milligrams (mg) of sodium per serving. Limit your daily sodium intake to less than 2,300 mg each day. Shopping  Avoid buying pre-made and processed foods. Cooking  Cook with olive oil. Do not use butter, lard, or ghee.  Bake, broil, grill, or boil foods. Avoid frying. Meal planning   Work with your dietitian to develop an eating plan that is right for you. This may include: ? Tracking how many calories you take in. Use a food diary, notebook, or mobile application to track what you eat at each meal. ? Using the glycemic index (GI) to plan your meals. The index tells you how quickly a food will raise your blood glucose. Choose low-GI foods. These foods take a longer time to raise blood glucose.  Consider following a Mediterranean diet. This diet includes: ? Several servings each day of fresh fruits and vegetables. ? Eating fish at least twice a week. ? Several servings each day of whole grains, beans, nuts, and seeds. ? Using olive oil instead of other fats. ? Moderate alcohol consumption. ? Eating small amounts of red meat and whole-fat dairy.  If you have high blood pressure, you may need to limit your sodium intake or follow a diet such as the DASH eating plan. DASH is an eating plan that aims to lower high blood pressure. What foods are recommended? The items listed below may not be a complete list. Talk with your dietitian about what dietary choices are best for you. Grains Whole grains, such as whole-wheat or whole-grain breads, crackers, cereals, and pasta. Unsweetened oatmeal. Bulgur. Barley. Quinoa. Brown rice. Corn or whole-wheat flour tortillas or taco shells. Vegetables Lettuce. Spinach. Peas. Beets. Cauliflower. Cabbage. Broccoli. Carrots. Tomatoes. Squash. Eggplant. Herbs. Peppers.  Onions. Cucumbers. Brussels sprouts. Fruits Berries. Bananas. Apples. Oranges. Grapes. Papaya. Mango. Pomegranate. Kiwi. Grapefruit. Cherries. Meats and other protein foods Seafood. Poultry without skin. Lean cuts of pork and beef. Tofu. Eggs. Nuts. Beans. Dairy Low-fat or fat-free dairy products, such as yogurt, cottage cheese, and cheese. Beverages Water. Tea. Coffee. Sugar-free or diet soda. Seltzer water. Lowfat or no-fat milk. Milk alternatives, such as soy or almond milk. Fats and oils Olive oil. Canola oil. Sunflower oil. Grapeseed oil. Avocado. Walnuts. Sweets and desserts Sugar-free or low-fat pudding. Sugar-free or low-fat ice cream and other frozen treats. Seasoning and other foods Herbs. Sodium-free spices. Mustard. Relish. Low-fat, low-sugar ketchup. Low-fat, low-sugar barbecue sauce. Low-fat or fat-free mayonnaise. What foods are not recommended? The items listed below may not be a  complete list. Talk with your dietitian about what dietary choices are best for you. Grains Refined white flour and flour products, such as bread, pasta, snack foods, and cereals. Vegetables Canned vegetables. Frozen vegetables with butter or cream sauce. Fruits Fruits canned with syrup. Meats and other protein foods Fatty cuts of meat. Poultry with skin. Breaded or fried meat. Processed meats. Dairy Full-fat yogurt, cheese, or milk. Beverages Sweetened drinks, such as sweet iced tea and soda. Fats and oils Butter. Lard. Ghee. Sweets and desserts Baked goods, such as cake, cupcakes, pastries, cookies, and cheesecake. Seasoning and other foods Spice mixes with added salt. Ketchup. Barbecue sauce. Mayonnaise. Summary  To prevent diabetes from developing, you may need to make diet and other lifestyle changes to help control blood sugar, improve cholesterol levels, and manage your blood pressure.  Set weight loss goals with the help of your health care team. It is recommended that most  people with prediabetes lose 7 percent of their current body weight.  Consider following a Mediterranean diet that includes plenty of fresh fruits and vegetables, whole grains, beans, nuts, seeds, fish, lean meat, low-fat dairy, and healthy oils. This information is not intended to replace advice given to you by your health care provider. Make sure you discuss any questions you have with your health care provider. Document Revised: 03/18/2019 Document Reviewed: 01/28/2017 Elsevier Patient Education  2020 Elsevier Inc.   Calorie Counting for [employer] Loss Calories are units of energy. Your body needs a certain amount of calories from food to keep you going throughout the day. When you eat more calories than your body needs, your body stores the extra calories as fat. When you eat fewer calories than your body needs, your body burns fat to get the energy it needs. Calorie counting means keeping track of how many calories you eat and drink each day. Calorie counting can be helpful if you need to lose weight. If you make sure to eat fewer calories than your body needs, you should lose weight. Ask your health care provider what a healthy weight is for you. For calorie counting to work, you will need to eat the right number of calories in a day in order to lose a healthy amount of weight per week. A dietitian can help you determine how many calories you need in a day and will give you suggestions on how to reach your calorie goal.  A healthy amount of weight to lose per week is usually 1-2 lb (0.5-0.9 kg). This usually means that your daily calorie intake should be reduced by 500-750 calories.  Eating 1,200 - 1,500 calories per day can help most women lose weight.  Eating 1,500 - 1,800 calories per day can help most men lose weight. What is my plan? My goal is to have __________ calories per day. If I have this many calories per day, I should lose around __________ pounds per week. What do I need to  know about calorie counting? In order to meet your daily calorie goal, you will need to:  Find out how many calories are in each food you would like to eat. Try to do this before you eat.  Decide how much of the food you plan to eat.  Write down what you ate and how many calories it had. Doing this is called keeping a food log. To successfully lose weight, it is important to balance calorie counting with a healthy lifestyle that includes regular activity. Aim for 150 minutes of moderate  exercise (such as walking) or 75 minutes of vigorous exercise (such as running) each week. Where do I find calorie information?  The number of calories in a food can be found on a Nutrition Facts label. If a food does not have a Nutrition Facts label, try to look up the calories online or ask your dietitian for help. Remember that calories are listed per serving. If you choose to have more than one serving of a food, you will have to multiply the calories per serving by the amount of servings you plan to eat. For example, the label on a package of bread might say that a serving size is 1 slice and that there are 90 calories in a serving. If you eat 1 slice, you will have eaten 90 calories. If you eat 2 slices, you will have eaten 180 calories. How do I keep a food log? Immediately after each meal, record the following information in your food log:  What you ate. Don't forget to include toppings, sauces, and other extras on the food.  How much you ate. This can be measured in cups, ounces, or number of items.  How many calories each food and drink had.  The total number of calories in the meal. Keep your food log near you, such as in a small notebook in your pocket, or use a mobile app or website. Some programs will calculate calories for you and show you how many calories you have left for the day to meet your goal. What are some calorie counting tips?   Use your calories on foods and drinks that will fill  you up and not leave you hungry: ? Some examples of foods that fill you up are nuts and nut butters, vegetables, lean proteins, and high-fiber foods like whole grains. High-fiber foods are foods with more than 5 g fiber per serving. ? Drinks such as sodas, specialty coffee drinks, alcohol, and juices have a lot of calories, yet do not fill you up.  Eat nutritious foods and avoid empty calories. Empty calories are calories you get from foods or beverages that do not have many vitamins or protein, such as candy, sweets, and soda. It is better to have a nutritious high-calorie food (such as an avocado) than a food with few nutrients (such as a bag of chips).  Know how many calories are in the foods you eat most often. This will help you calculate calorie counts faster.  Pay attention to calories in drinks. Low-calorie drinks include water and unsweetened drinks.  Pay attention to nutrition labels for "low fat" or "fat free" foods. These foods sometimes have the same amount of calories or more calories than the full fat versions. They also often have added sugar, starch, or salt, to make up for flavor that was removed with the fat.  Find a way of tracking calories that works for you. Get creative. Try different apps or programs if writing down calories does not work for you. What are some portion control tips?  Know how many calories are in a serving. This will help you know how many servings of a certain food you can have.  Use a measuring cup to measure serving sizes. You could also try weighing out portions on a kitchen scale. With time, you will be able to estimate serving sizes for some foods.  Take some time to put servings of different foods on your favorite plates, bowls, and cups so you know what a serving looks like.  Try not to eat straight from a bag or box. Doing this can lead to overeating. Put the amount you would like to eat in a cup or on a plate to make sure you are eating the  right portion.  Use smaller plates, glasses, and bowls to prevent overeating.  Try not to multitask (for example, watch TV or use your computer) while eating. If it is time to eat, sit down at a table and enjoy your food. This will help you to know when you are full. It will also help you to be aware of what you are eating and how much you are eating. What are tips for following this plan? Reading food labels  Check the calorie count compared to the serving size. The serving size may be smaller than what you are used to eating.  Check the source of the calories. Make sure the food you are eating is high in vitamins and protein and low in saturated and trans fats. Shopping  Read nutrition labels while you shop. This will help you make healthy decisions before you decide to purchase your food.  Make a grocery list and stick to it. Cooking  Try to cook your favorite foods in a healthier way. For example, try baking instead of frying.  Use low-fat dairy products. Meal planning  Use more fruits and vegetables. Half of your plate should be fruits and vegetables.  Include lean proteins like poultry and fish. How do I count calories when eating out?  Ask for smaller portion sizes.  Consider sharing an entree and sides instead of getting your own entree.  If you get your own entree, eat only half. Ask for a box at the beginning of your meal and put the rest of your entree in it so you are not tempted to eat it.  If calories are listed on the menu, choose the lower calorie options.  Choose dishes that include vegetables, fruits, whole grains, low-fat dairy products, and lean protein.  Choose items that are boiled, broiled, grilled, or steamed. Stay away from items that are buttered, battered, fried, or served with cream sauce. Items labeled "crispy" are usually fried, unless stated otherwise.  Choose water, low-fat milk, unsweetened iced tea, or other drinks without added sugar. If you  want an alcoholic beverage, choose a lower calorie option such as a glass of wine or light beer.  Ask for dressings, sauces, and syrups on the side. These are usually high in calories, so you should limit the amount you eat.  If you want a salad, choose a garden salad and ask for grilled meats. Avoid extra toppings like bacon, cheese, or fried items. Ask for the dressing on the side, or ask for olive oil and vinegar or lemon to use as dressing.  Estimate how many servings of a food you are given. For example, a serving of cooked rice is  cup or about the size of half a baseball. Knowing serving sizes will help you be aware of how much food you are eating at restaurants. The list below tells you how big or small some common portion sizes are based on everyday objects: ? 1 oz-4 stacked dice. ? 3 oz-1 deck of cards. ? 1 tsp-1 die. ? 1 Tbsp- a ping-pong ball. ? 2 Tbsp-1 ping-pong ball. ?  cup- baseball. ? 1 cup-1 baseball. Summary  Calorie counting means keeping track of how many calories you eat and drink each day. If you eat fewer calories than your  body needs, you should lose weight.  A healthy amount of weight to lose per week is usually 1-2 lb (0.5-0.9 kg). This usually means reducing your daily calorie intake by 500-750 calories.  The number of calories in a food can be found on a Nutrition Facts label. If a food does not have a Nutrition Facts label, try to look up the calories online or ask your dietitian for help.  Use your calories on foods and drinks that will fill you up, and not on foods and drinks that will leave you hungry.  Use smaller plates, glasses, and bowls to prevent overeating. This information is not intended to replace advice given to you by your health care provider. Make sure you discuss any questions you have with your health care provider. Document Revised: 08/13/2018 Document Reviewed: 10/24/2016 Elsevier Patient Education  2020 Elsevier Inc.   Fat and  Cholesterol Restricted Eating Plan Getting too much fat and cholesterol in your diet may cause health problems. Choosing the right foods helps keep your fat and cholesterol at normal levels. This can keep you from getting certain diseases. Your doctor may recommend an eating plan that includes:  Total fat: ______% or less of total calories a day.  Saturated fat: ______% or less of total calories a day.  Cholesterol: less than _________mg a day.  Fiber: ______g a day. What are tips for following this plan? Meal planning  At meals, divide your plate into four equal parts: ? Fill one-half of your plate with vegetables and green salads. ? Fill one-fourth of your plate with whole grains. ? Fill one-fourth of your plate with low-fat (lean) protein foods.  Eat fish that is high in omega-3 fats at least two times a week. This includes mackerel, tuna, sardines, and salmon.  Eat foods that are high in fiber, such as whole grains, beans, apples, broccoli, carrots, peas, and barley. General tips   Work with your doctor to lose weight if you need to.  Avoid: ? Foods with added sugar. ? Fried foods. ? Foods with partially hydrogenated oils.  Limit alcohol intake to no more than 1 drink a day for nonpregnant women and 2 drinks a day for men. One drink equals 12 oz of beer, 5 oz of wine, or 1 oz of hard liquor. Reading food labels  Check food labels for: ? Trans fats. ? Partially hydrogenated oils. ? Saturated fat (g) in each serving. ? Cholesterol (mg) in each serving. ? Fiber (g) in each serving.  Choose foods with healthy fats, such as: ? Monounsaturated fats. ? Polyunsaturated fats. ? Omega-3 fats.  Choose grain products that have whole grains. Look for the word "whole" as the first word in the ingredient list. Cooking  Cook foods using low-fat methods. These include baking, boiling, grilling, and broiling.  Eat more home-cooked foods. Eat at restaurants and buffets less  often.  Avoid cooking using saturated fats, such as butter, cream, palm oil, palm kernel oil, and coconut oil. Recommended foods  Fruits  All fresh, canned (in natural juice), or frozen fruits. Vegetables  Fresh or frozen vegetables (raw, steamed, roasted, or grilled). Green salads. Grains  Whole grains, such as whole wheat or whole grain breads, crackers, cereals, and pasta. Unsweetened oatmeal, bulgur, barley, quinoa, or brown rice. Corn or whole wheat flour tortillas. Meats and other protein foods  Ground beef (85% or leaner), grass-fed beef, or beef trimmed of fat. Skinless chicken or [place]. Ground chicken or [place]. Pork trimmed of fat. All  fish and seafood. Egg whites. Dried beans, peas, or lentils. Unsalted nuts or seeds. Unsalted canned beans. Nut butters without added sugar or oil. Dairy  Low-fat or nonfat dairy products, such as skim or 1% milk, 2% or reduced-fat cheeses, low-fat and fat-free ricotta or cottage cheese, or plain low-fat and nonfat yogurt. Fats and oils  Tub margarine without trans fats. Light or reduced-fat mayonnaise and salad dressings. Avocado. Olive, canola, sesame, or safflower oils. The items listed above may not be a complete list of foods and beverages you can eat. Contact a dietitian for more information. Foods to avoid Fruits  Canned fruit in heavy syrup. Fruit in cream or butter sauce. Fried fruit. Vegetables  Vegetables cooked in cheese, cream, or butter sauce. Fried vegetables. Grains  White bread. White pasta. White rice. Cornbread. Bagels, pastries, and croissants. Crackers and snack foods that contain trans fat and hydrogenated oils. Meats and other protein foods  Fatty cuts of meat. Ribs, chicken wings, bacon, sausage, bologna, salami, chitterlings, fatback, hot dogs, bratwurst, and packaged lunch meats. Liver and organ meats. Whole eggs and egg yolks. Chicken and [place] with skin. Fried meat. Dairy  Whole or 2% milk, cream,  half-and-half, and cream cheese. Whole milk cheeses. Whole-fat or sweetened yogurt. Full-fat cheeses. Nondairy creamers and whipped toppings. Processed cheese, cheese spreads, and cheese curds. Beverages  Alcohol. Sugar-sweetened drinks such as sodas, lemonade, and fruit drinks. Fats and oils  Butter, stick margarine, lard, shortening, ghee, or bacon fat. Coconut, palm kernel, and palm oils. Sweets and desserts  Corn syrup, sugars, honey, and molasses. Candy. Jam and jelly. Syrup. Sweetened cereals. Cookies, pies, cakes, donuts, muffins, and ice cream. The items listed above may not be a complete list of foods and beverages you should avoid. Contact a dietitian for more information. Summary  Choosing the right foods helps keep your fat and cholesterol at normal levels. This can keep you from getting certain diseases.  At meals, fill one-half of your plate with vegetables and green salads.  Eat high-fiber foods, like whole grains, beans, apples, carrots, peas, and barley.  Limit added sugar, saturated fats, alcohol, and fried foods. This information is not intended to replace advice given to you by your health care provider. Make sure you discuss any questions you have with your health care provider. Document Revised: 07/28/2018 Document Reviewed: 08/11/2017 Elsevier Patient Education  2020 [employer].

## 2020-03-14 ENCOUNTER — Other Ambulatory Visit: Payer: Self-pay | Admitting: Adult Health

## 2020-03-14 DIAGNOSIS — E559 Vitamin D deficiency, unspecified: Secondary | ICD-10-CM | POA: Diagnosis not present

## 2020-03-14 DIAGNOSIS — R7303 Prediabetes: Secondary | ICD-10-CM | POA: Diagnosis not present

## 2020-03-14 DIAGNOSIS — D649 Anemia, unspecified: Secondary | ICD-10-CM | POA: Diagnosis not present

## 2020-03-14 DIAGNOSIS — R7989 Other specified abnormal findings of blood chemistry: Secondary | ICD-10-CM | POA: Diagnosis not present

## 2020-03-14 DIAGNOSIS — Z13220 Encounter for screening for lipoid disorders: Secondary | ICD-10-CM | POA: Diagnosis not present

## 2020-03-15 LAB — CBC WITH DIFFERENTIAL/PLATELET
Basophils Absolute: 0 10*3/uL (ref 0.0–0.2)
Basos: 1 %
EOS (ABSOLUTE): 0.1 10*3/uL (ref 0.0–0.4)
Eos: 2 %
Hematocrit: 39.8 % (ref 37.5–51.0)
Hemoglobin: 12.5 g/dL — ABNORMAL LOW (ref 13.0–17.7)
Immature Grans (Abs): 0 10*3/uL (ref 0.0–0.1)
Immature Granulocytes: 0 %
Lymphocytes Absolute: 1.5 10*3/uL (ref 0.7–3.1)
Lymphs: 32 %
MCH: 22.6 pg — ABNORMAL LOW (ref 26.6–33.0)
MCHC: 31.4 g/dL — ABNORMAL LOW (ref 31.5–35.7)
MCV: 72 fL — ABNORMAL LOW (ref 79–97)
Monocytes Absolute: 0.4 10*3/uL (ref 0.1–0.9)
Monocytes: 8 %
Neutrophils Absolute: 2.6 10*3/uL (ref 1.4–7.0)
Neutrophils: 57 %
Platelets: 294 10*3/uL (ref 150–450)
RBC: 5.53 x10E6/uL (ref 4.14–5.80)
RDW: 14.7 % (ref 11.6–15.4)
WBC: 4.6 10*3/uL (ref 3.4–10.8)

## 2020-03-15 LAB — LIPID PANEL W/O CHOL/HDL RATIO
Cholesterol, Total: 179 mg/dL (ref 100–199)
HDL: 39 mg/dL — ABNORMAL LOW (ref >39)
LDL Chol Calc (NIH): 129 mg/dL — ABNORMAL HIGH (ref 0–99)
Triglycerides: 57 mg/dL (ref 0–149)
VLDL Cholesterol Cal: 11 mg/dL (ref 5–40)

## 2020-03-15 LAB — VITAMIN D 25 HYDROXY (VIT D DEFICIENCY, FRACTURES): Vit D, 25-Hydroxy: 67.7 ng/mL (ref 30.0–100.0)

## 2020-03-15 LAB — HEMOGLOBIN A1C
Est. average glucose Bld gHb Est-mCnc: 123 mg/dL
Hgb A1c MFr Bld: 5.9 % — ABNORMAL HIGH (ref 4.8–5.6)

## 2020-03-19 DIAGNOSIS — G510 Bell's palsy: Secondary | ICD-10-CM | POA: Diagnosis not present

## 2020-03-21 ENCOUNTER — Telehealth: Payer: Self-pay

## 2020-03-21 LAB — IRON,TIBC AND FERRITIN PANEL
Ferritin: 137 ng/mL (ref 30–400)
Iron Saturation: 32 % (ref 15–55)
Iron: 87 ug/dL (ref 38–169)
Total Iron Binding Capacity: 275 ug/dL (ref 250–450)
UIBC: 188 ug/dL (ref 111–343)

## 2020-03-21 LAB — FOLATE: Folate: 12.5 ng/mL (ref >3.0)

## 2020-03-21 LAB — SPECIMEN STATUS REPORT

## 2020-03-21 NOTE — Progress Notes (Signed)
  Please advise patient. Patient is slightly anemic, if he is not taking a multivitamin I recommend he take an over the counter multivitamin with iron and recheck labs around/ after 3 months - add CBC repeat in 3 months to orders.  He should proceed with colonoscopy that has been ordered  and verify if any known rectal bleeding or dark colored or tarry stools ? Repeat office visit after labs is 3 months.

## 2020-03-21 NOTE — Telephone Encounter (Signed)
I wanted to follow back up in regards to referral that was ordered on 12/06/2019 for colonoscopy screening. Wife states that they can be reached at [phone]. If I need to update and put in new order please let me know. KW

## 2020-03-23 NOTE — Telephone Encounter (Signed)
Called pt to discuss referral but wife answered on 03/21/20. She is not listed on DPR. I did ask her to let him know I called

## 2020-07-04 NOTE — Progress Notes (Signed)
Established patient visit   Patient: Roger Gamble   DOB: 16-Jan-1966   54 y.o. Male  MRN: [id number] Visit Date: 07/05/2020  Today's healthcare provider: Jairo Ben, FNP   Chief Complaint  Patient presents with  . Follow-up   Subjective    HPI  Follow up for abnormal lab results ( Vitamin D deficiency/ Prediabetes)  The patient was last seen for this 3 months ago. Changes made at last visit include started patient on Vitamin D 40981 and encouraging to start taking multivitamin with iron.  He reports excellent compliance with treatment.   Today he also complains with persistent numbness of his left sided face, he has returned most motion to the left side of his face however still has some numbness and pain reported since his Bell's palsy episode and December. He denies any slurred speech tripping, or falling.  Denies any speech disturbances, or memory loss.   He notices that this left side of his face is slightly more swollen at times than other times..  Reports a feeling of pain on the left side of his face at times.  Reports that is all happening after his Bell's palsy that he was seen for in the ER on 11/11/2020  Denies sinus drainage, sinus pressure or ear pain. Denies any difficulty chewing or swallowing. Denies headaches.  Denies weakness. He denies any head trauma. Patient  denies any fever, body aches,chills, rash, chest pain, shortness of breath, nausea, vomiting, or diarrhea.  Denies dizziness, lightheadedness, pre syncopal or syncopal episodes.    -----------------------------------------------------------------------------------------    Patient Active Problem List   Diagnosis Date Noted  . Iron deficiency anemia 07/05/2020  . Abnormal CBC 07/05/2020  . Vitamin D deficiency 07/05/2020   History reviewed. No pertinent past medical history. No Known Allergies     Medications: Outpatient Medications Prior to Visit  Medication Sig  .  [DISCONTINUED] predniSONE (DELTASONE) 20 MG tablet By mouth:Take 3 tablets(60mg ) for 5 days, take 2 tablets(40mg ) for 3 days, take 1(20mg ) tablet for 2 days, take 1/2 tablet (10mg ) for 2 days and discontinue.  . [DISCONTINUED] Vitamin D, Ergocalciferol, (DRISDOL) 1.25 MG (50000 UNIT) CAPS capsule Take 1 capsule (50,000 Units total) by mouth every 7 (seven) days.   No facility-administered medications prior to visit.    Review of Systems  Constitutional: Negative.   HENT: Negative.   Eyes: Negative.   Respiratory: Negative.   Cardiovascular: Negative.   Gastrointestinal: Negative.   Genitourinary: Negative.   Musculoskeletal: Negative.   Skin: Negative.   Neurological: Positive for numbness (Left side of face has been persistent since December,.). Negative for dizziness, tremors, seizures, syncope, facial asymmetry, speech difficulty, weakness, light-headedness and headaches.  Psychiatric/Behavioral: Negative.     Last CBC Lab Results  Component Value Date   WBC 4.6 03/14/2020   HGB 12.5 (L) 03/14/2020   HCT 39.8 03/14/2020   MCV 72 (L) 03/14/2020   MCH 22.6 (L) 03/14/2020   RDW 14.7 03/14/2020   PLT 294 03/14/2020   Last metabolic panel Lab Results  Component Value Date   GLUCOSE 111 (H) 12/06/2019   NA 139 12/06/2019   K 4.5 12/06/2019   CL 103 12/06/2019   CO2 23 12/06/2019   BUN 20 12/06/2019   CREATININE 0.77 12/06/2019   GFRNONAA 105 12/06/2019   GFRAA 121 12/06/2019   CALCIUM 9.6 12/06/2019   PROT 7.0 12/06/2019   ALBUMIN 4.5 12/06/2019   LABGLOB 2.5 12/06/2019   AGRATIO 1.8 12/06/2019  BILITOT 0.6 12/06/2019   ALKPHOS 63 12/06/2019   AST 6 12/06/2019   ALT 16 12/06/2019   ANIONGAP 9 11/12/2019   Last lipids Lab Results  Component Value Date   CHOL 179 03/14/2020   HDL 39 (L) 03/14/2020   LDLCALC 129 (H) 03/14/2020   TRIG 57 03/14/2020   Last hemoglobin A1c Lab Results  Component Value Date   HGBA1C 5.9 (H) 03/14/2020   Last thyroid  functions Lab Results  Component Value Date   TSH 2.340 12/06/2019   Last vitamin D Lab Results  Component Value Date   VD25OH 67.7 03/14/2020      Objective    BP 124/82   Pulse 59   Temp (!) 96.2 F (35.7 C) (Oral)   Resp 16   Wt 179 lb 3.2 oz (81.3 kg)   SpO2 99%   BMI 27.65 kg/m    Physical Exam Vitals reviewed. Nursing note reviewed: .alert.  Constitutional:      General: He is not in acute distress.    Appearance: Normal appearance. He is not ill-appearing, toxic-appearing or diaphoretic.     Comments: Patient appers well, not sickly. Speaking in complete sentences. Patient moves on and off of exam table and in room without difficulty. Gait is normal in hall and in room. Patient is oriented to person place time and situation. Patient answers questions appropriately and engages eye contact and verbal dialect with provider.    HENT:     Head: Normocephalic and atraumatic.     Right Ear: Tympanic membrane, ear canal and external ear normal. There is no impacted cerumen.     Left Ear: Tympanic membrane, ear canal and external ear normal. There is no impacted cerumen.     Nose: Nose normal. No congestion or rhinorrhea.     Mouth/Throat:     Mouth: Mucous membranes are moist.     Pharynx: No oropharyngeal exudate or posterior oropharyngeal erythema.  Eyes:     General: No visual field deficit or scleral icterus.       Right eye: No discharge.        Left eye: No discharge.     Extraocular Movements: Extraocular movements intact.     Conjunctiva/sclera: Conjunctivae normal.     Pupils: Pupils are equal, round, and reactive to light.  Neck:     Vascular: No carotid bruit.  Cardiovascular:     Rate and Rhythm: Normal rate and regular rhythm.     Pulses: Normal pulses.     Heart sounds: Normal heart sounds. No murmur heard.  No friction rub. No gallop.   Pulmonary:     Effort: Pulmonary effort is normal. No respiratory distress.     Breath sounds: Normal breath  sounds. No stridor. No wheezing, rhonchi or rales.  Chest:     Chest wall: No tenderness.  Abdominal:     General: There is no distension.     Palpations: Abdomen is soft. There is no mass.     Tenderness: There is no abdominal tenderness. There is no right CVA tenderness, left CVA tenderness, guarding or rebound.     Hernia: No hernia is present.  Musculoskeletal:        General: No swelling, tenderness, deformity or signs of injury. Normal range of motion.     Cervical back: Normal range of motion and neck supple. No rigidity or tenderness.     Right lower leg: No edema.     Left lower leg: No edema.  Lymphadenopathy:     Cervical: No cervical adenopathy.  Skin:    General: Skin is warm.     Capillary Refill: Capillary refill takes less than 2 seconds.  Neurological:     Mental Status: He is alert and oriented to person, place, and time.     GCS: GCS eye subscore is 4. GCS verbal subscore is 5. GCS motor subscore is 6.     Cranial Nerves: Cranial nerve deficit and facial asymmetry (left side of smile lower than right ) present. No dysarthria.     Sensory: Sensation is intact. No sensory deficit.     Motor: No weakness, tremor, atrophy, abnormal muscle tone, seizure activity or pronator drift.     Coordination: Coordination is intact. Romberg sign negative. Coordination normal. Finger-Nose-Finger Test and Heel to [facility] Test normal. Rapid alternating movements normal.     Gait: Gait normal.     Deep Tendon Reflexes: Reflexes normal.     Reflex Scores:      Tricep reflexes are 2+ on the right side and 2+ on the left side.      Brachioradialis reflexes are 2+ on the right side and 2+ on the left side.      Achilles reflexes are 2+ on the right side and 2+ on the left side.    Comments: Left side of face smile slightly lower than right ( he denies no change since December 2021)   Neurologic exam:  Speech clear, pupils equal round reactive to light, extraocular movements intact  Normal  peripheral visual fields Cranial nerve 7 - mild facial droop with smile only all others within normal limits.   Follows commands, moves all extremities x4, normal strength to bilateral upper and lower extremities at all major muscle groups including grip Sensation normal to light touch and pinprick Coordination intact, no limb ataxia, finger-nose-finger normal, heel shin normal bilaterally Rapid alternating movements normal No pronator drift Gait normal Can heal and toe walk without weakness.   Psychiatric:        Mood and Affect: Mood normal.        Behavior: Behavior normal.        Thought Content: Thought content normal.        Judgment: Judgment normal.     No results found for any visits on 07/05/20.  Assessment & Plan     The primary encounter diagnosis was Vitamin D deficiency. Diagnoses of Abnormal CBC, Bell's palsy- history of 11/2019, Iron deficiency anemia, unspecified iron deficiency anemia type, Nerve pain, and Body mass index 27.0-27.9, adult-  were also pertinent to this visit.   Orders Placed This Encounter  Procedures  . MR Brain Wo Contrast    Order Specific Question:   What is the patient's sedation requirement?    Answer:   No Sedation    Order Specific Question:   Does the patient have a pacemaker or implanted devices?    Answer:   No    Order Specific Question:   Preferred imaging location?    Answer:   [facility] (table limit - 550lbs)    Order Specific Question:   Call Results- Best Contact Number?    Answer:   [phone]    Order Specific Question:   Radiology Contrast Protocol - do NOT remove file path    Answer:   [email or web address]  . CBC with Differential/Platelet  . Iron, TIBC and Ferritin Panel  . Folate  . VITAMIN D 25 Hydroxy (Vit-D Deficiency, Fractures)  . B12  and Folate Panel  . Ambulatory referral to Neurology    Referral Priority:   Urgent    Referral Type:   Consultation    Referral Reason:    Specialty Services Required    Referred to Provider:   Lonell Face, MD    Requested Specialty:   Neurology    Number of Visits Requested:   1   Meds ordered this encounter  Medications  . gabapentin (NEURONTIN) 300 MG capsule    Sig: Take 1 capsule (300 mg total) by mouth 2 (two) times daily.    Dispense:  90 capsule    Refill:  0  . predniSONE (STERAPRED UNI-PAK 21 TAB) 10 MG (21) TBPK tablet    Sig: PO: Take 6 tablets on day 1:Take 5 tablets day 2:Take 4 tablets day 3: Take 3 tablets day 4:Take 2 tablets day five: 5 Take 1 tablet day 6    Dispense:  21 tablet    Refill:  0  . aspirin EC 81 MG tablet    Sig: Take 1 tablet (81 mg total) by mouth daily. Swallow whole.    Dispense:  90 tablet    Refill:  0   Since that neuropathic pain from previous Bell's palsy 11/12/2019, patient however had CT only done with out contrast in the ER at that time which showed no acute infarction no acute intracranial findings.  Will order MRI to rule out any kind of infarction.  Patient prefers not to go to the emergency room and has no urgent emergent symptoms given the Covid pandemic he prefers to have outpatient treatment.  Will order MRI and refer to Dr. Clelia Croft for further evaluation.  Will start Neurontin.  Reviewed with patient signs of stroke and any worsening symptoms to proceed to the nearest ER or call 911 do not self drive.  Red Flags discussed. The patient was given clear instructions to go to ER or return to medical center if any red flags develop, symptoms do not improve, worsen or new problems develop. They verbalized understanding.  Maintenance care also needed colonoscopy GI referral was placed 03/21/2020, GI tried to contact patient received his wife and she does not listen on the DPR -  patient is aware to call GI to schedule colonoscopy. Return in about 1 week (around 07/12/2020), or if symptoms worsen or fail to improve, for at any time for any worsening symptoms, Go to Emergency room/  urgent care if worse.      IBeverely Pace Malaiah Viramontes, FNP, have reviewed all documentation for this visit. The documentation on 07/05/20 for the exam, diagnosis, procedures, and orders are all accurate and complete.    Jairo Ben, FNP  [facility] [phone] (phone) [phone] (fax)  [facility]

## 2020-07-05 ENCOUNTER — Ambulatory Visit: Payer: BC Managed Care – PPO | Admitting: Adult Health

## 2020-07-05 ENCOUNTER — Encounter: Payer: Self-pay | Admitting: Adult Health

## 2020-07-05 ENCOUNTER — Other Ambulatory Visit: Payer: Self-pay

## 2020-07-05 VITALS — BP 124/82 | HR 59 | Temp 96.2°F | Resp 16 | Wt 179.2 lb

## 2020-07-05 DIAGNOSIS — R7989 Other specified abnormal findings of blood chemistry: Secondary | ICD-10-CM

## 2020-07-05 DIAGNOSIS — E559 Vitamin D deficiency, unspecified: Secondary | ICD-10-CM | POA: Insufficient documentation

## 2020-07-05 DIAGNOSIS — D509 Iron deficiency anemia, unspecified: Secondary | ICD-10-CM | POA: Diagnosis not present

## 2020-07-05 DIAGNOSIS — M792 Neuralgia and neuritis, unspecified: Secondary | ICD-10-CM

## 2020-07-05 DIAGNOSIS — Z6827 Body mass index (BMI) 27.0-27.9, adult: Secondary | ICD-10-CM

## 2020-07-05 DIAGNOSIS — G510 Bell's palsy: Secondary | ICD-10-CM

## 2020-07-05 MED ORDER — GABAPENTIN 300 MG PO CAPS: 300.0000 mg | ORAL_CAPSULE | Freq: Two times a day (BID) | ORAL | 0 refills | Status: DC

## 2020-07-05 MED ORDER — ASPIRIN EC 81 MG PO TBEC: 81.0000 mg | DELAYED_RELEASE_TABLET | Freq: Every day | ORAL | 0 refills | Status: DC

## 2020-07-05 MED ORDER — PREDNISONE 10 MG (21) PO TBPK: ORAL_TABLET | ORAL | 0 refills | Status: DC

## 2020-07-05 NOTE — Patient Instructions (Addendum)
 Calorie Counting for Weight Loss Calories are units of energy. Your body needs a certain amount of calories from food to keep you going throughout the day. When you eat more calories than your body needs, your body stores the extra calories as fat. When you eat fewer calories than your body needs, your body burns fat to get the energy it needs. Calorie counting means keeping track of how many calories you eat and drink each day. Calorie counting can be helpful if you need to lose weight. If you make sure to eat fewer calories than your body needs, you should lose weight. Ask your health care provider what a healthy weight is for you. For calorie counting to work, you will need to eat the right number of calories in a day in order to lose a healthy amount of weight per week. A dietitian can help you determine how many calories you need in a day and will give you suggestions on how to reach your calorie goal.  A healthy amount of weight to lose per week is usually 1-2 lb (0.5-0.9 kg). This usually means that your daily calorie intake should be reduced by 500-750 calories.  Eating 1,200 - 1,500 calories per day can help most women lose weight.  Eating 1,500 - 1,800 calories per day can help most men lose weight. What is my plan? My goal is to have __________ calories per day. If I have this many calories per day, I should lose around __________ pounds per week. What do I need to know about calorie counting? In order to meet your daily calorie goal, you will need to:  Find out how many calories are in each food you would like to eat. Try to do this before you eat.  Decide how much of the food you plan to eat.  Write down what you ate and how many calories it had. Doing this is called keeping a food log. To successfully lose weight, it is important to balance calorie counting with a healthy lifestyle that includes regular activity. Aim for 150 minutes of moderate exercise (such as walking) or 75  minutes of vigorous exercise (such as running) each week. Where do I find calorie information?  The number of calories in a food can be found on a Nutrition Facts label. If a food does not have a Nutrition Facts label, try to look up the calories online or ask your dietitian for help. Remember that calories are listed per serving. If you choose to have more than one serving of a food, you will have to multiply the calories per serving by the amount of servings you plan to eat. For example, the label on a package of bread might say that a serving size is 1 slice and that there are 90 calories in a serving. If you eat 1 slice, you will have eaten 90 calories. If you eat 2 slices, you will have eaten 180 calories. How do I keep a food log? Immediately after each meal, record the following information in your food log:  What you ate. Don't forget to include toppings, sauces, and other extras on the food.  How much you ate. This can be measured in cups, ounces, or number of items.  How many calories each food and drink had.  The total number of calories in the meal. Keep your food log near you, such as in a small notebook in your pocket, or use a mobile app or website. Some programs will   calculate calories for you and show you how many calories you have left for the day to meet your goal. What are some calorie counting tips?   Use your calories on foods and drinks that will fill you up and not leave you hungry: ? Some examples of foods that fill you up are nuts and nut butters, vegetables, lean proteins, and high-fiber foods like whole grains. High-fiber foods are foods with more than 5 g fiber per serving. ? Drinks such as sodas, specialty coffee drinks, alcohol, and juices have a lot of calories, yet do not fill you up.  Eat nutritious foods and avoid empty calories. Empty calories are calories you get from foods or beverages that do not have many vitamins or protein, such as candy, sweets, and  soda. It is better to have a nutritious high-calorie food (such as an avocado) than a food with few nutrients (such as a bag of chips).  Know how many calories are in the foods you eat most often. This will help you calculate calorie counts faster.  Pay attention to calories in drinks. Low-calorie drinks include water and unsweetened drinks.  Pay attention to nutrition labels for "low fat" or "fat free" foods. These foods sometimes have the same amount of calories or more calories than the full fat versions. They also often have added sugar, starch, or salt, to make up for flavor that was removed with the fat.  Find a way of tracking calories that works for you. Get creative. Try different apps or programs if writing down calories does not work for you. What are some portion control tips?  Know how many calories are in a serving. This will help you know how many servings of a certain food you can have.  Use a measuring cup to measure serving sizes. You could also try weighing out portions on a kitchen scale. With time, you will be able to estimate serving sizes for some foods.  Take some time to put servings of different foods on your favorite plates, bowls, and cups so you know what a serving looks like.  Try not to eat straight from a bag or box. Doing this can lead to overeating. Put the amount you would like to eat in a cup or on a plate to make sure you are eating the right portion.  Use smaller plates, glasses, and bowls to prevent overeating.  Try not to multitask (for example, watch TV or use your computer) while eating. If it is time to eat, sit down at a table and enjoy your food. This will help you to know when you are full. It will also help you to be aware of what you are eating and how much you are eating. What are tips for following this plan? Reading food labels  Check the calorie count compared to the serving size. The serving size may be smaller than what you are used to  eating.  Check the source of the calories. Make sure the food you are eating is high in vitamins and protein and low in saturated and trans fats. Shopping  Read nutrition labels while you shop. This will help you make healthy decisions before you decide to purchase your food.  Make a grocery list and stick to it. Cooking  Try to cook your favorite foods in a healthier way. For example, try baking instead of frying.  Use low-fat dairy products. Meal planning  Use more fruits and vegetables. Half of your plate should be  fruits and vegetables.  Include lean proteins like poultry and fish. How do I count calories when eating out?  Ask for smaller portion sizes.  Consider sharing an entree and sides instead of getting your own entree.  If you get your own entree, eat only half. Ask for a box at the beginning of your meal and put the rest of your entree in it so you are not tempted to eat it.  If calories are listed on the menu, choose the lower calorie options.  Choose dishes that include vegetables, fruits, whole grains, low-fat dairy products, and lean protein.  Choose items that are boiled, broiled, grilled, or steamed. Stay away from items that are buttered, battered, fried, or served with cream sauce. Items labeled "crispy" are usually fried, unless stated otherwise.  Choose water, low-fat milk, unsweetened iced tea, or other drinks without added sugar. If you want an alcoholic beverage, choose a lower calorie option such as a glass of wine or light beer.  Ask for dressings, sauces, and syrups on the side. These are usually high in calories, so you should limit the amount you eat.  If you want a salad, choose a garden salad and ask for grilled meats. Avoid extra toppings like bacon, cheese, or fried items. Ask for the dressing on the side, or ask for olive oil and vinegar or lemon to use as dressing.  Estimate how many servings of a food you are given. For example, a serving of  cooked rice is  cup or about the size of half a baseball. Knowing serving sizes will help you be aware of how much food you are eating at restaurants. The list below tells you how big or small some common portion sizes are based on everyday objects: ? 1 oz--4 stacked dice. ? 3 oz--1 deck of cards. ? 1 tsp--1 die. ? 1 Tbsp-- a ping-pong ball. ? 2 Tbsp--1 ping-pong ball. ?  cup-- baseball. ? 1 cup--1 baseball. Summary  Calorie counting means keeping track of how many calories you eat and drink each day. If you eat fewer calories than your body needs, you should lose weight.  A healthy amount of weight to lose per week is usually 1-2 lb (0.5-0.9 kg). This usually means reducing your daily calorie intake by 500-750 calories.  The number of calories in a food can be found on a Nutrition Facts label. If a food does not have a Nutrition Facts label, try to look up the calories online or ask your dietitian for help.  Use your calories on foods and drinks that will fill you up, and not on foods and drinks that will leave you hungry.  Use smaller plates, glasses, and bowls to prevent overeating. This information is not intended to replace advice given to you by your health care provider. Make sure you discuss any questions you have with your health care provider. Document Revised: 08/13/2018 Document Reviewed: 10/24/2016 Elsevier Patient Education  2020 [employer]   Fat and Cholesterol Restricted Eating Plan Getting too much fat and cholesterol in your diet may cause health problems. Choosing the right foods helps keep your fat and cholesterol at normal levels. This can keep you from getting certain diseases. Your doctor may recommend an eating plan that includes:  Total fat: ______% or less of total calories a day.  Saturated fat: ______% or less of total calories a day.  Cholesterol: less than _________mg a day.  Fiber: ______g a day. What are tips for following this   plan? Meal  planning  At meals, divide your plate into four equal parts: ? Fill one-half of your plate with vegetables and green salads. ? Fill one-fourth of your plate with whole grains. ? Fill one-fourth of your plate with low-fat (lean) protein foods.  Eat fish that is high in omega-3 fats at least two times a week. This includes mackerel, tuna, sardines, and salmon.  Eat foods that are high in fiber, such as whole grains, beans, apples, broccoli, carrots, peas, and barley. General tips   Work with your doctor to lose weight if you need to.  Avoid: ? Foods with added sugar. ? Fried foods. ? Foods with partially hydrogenated oils.  Limit alcohol intake to no more than 1 drink a day for nonpregnant women and 2 drinks a day for men. One drink equals 12 oz of beer, 5 oz of wine, or 1 oz of hard liquor. Reading food labels  Check food labels for: ? Trans fats. ? Partially hydrogenated oils. ? Saturated fat (g) in each serving. ? Cholesterol (mg) in each serving. ? Fiber (g) in each serving.  Choose foods with healthy fats, such as: ? Monounsaturated fats. ? Polyunsaturated fats. ? Omega-3 fats.  Choose grain products that have whole grains. Look for the word "whole" as the first word in the ingredient list. Cooking  Cook foods using low-fat methods. These include baking, boiling, grilling, and broiling.  Eat more home-cooked foods. Eat at restaurants and buffets less often.  Avoid cooking using saturated fats, such as butter, cream, palm oil, palm kernel oil, and coconut oil. Recommended foods  Fruits  All fresh, canned (in natural juice), or frozen fruits. Vegetables  Fresh or frozen vegetables (raw, steamed, roasted, or grilled). Green salads. Grains  Whole grains, such as whole wheat or whole grain breads, crackers, cereals, and pasta. Unsweetened oatmeal, bulgur, barley, quinoa, or brown rice. Corn or whole wheat flour tortillas. Meats and other protein foods  Ground  beef (85% or leaner), grass-fed beef, or beef trimmed of fat. Skinless chicken or turkey. Ground chicken or turkey. Pork trimmed of fat. All fish and seafood. Egg whites. Dried beans, peas, or lentils. Unsalted nuts or seeds. Unsalted canned beans. Nut butters without added sugar or oil. Dairy  Low-fat or nonfat dairy products, such as skim or 1% milk, 2% or reduced-fat cheeses, low-fat and fat-free ricotta or cottage cheese, or plain low-fat and nonfat yogurt. Fats and oils  Tub margarine without trans fats. Light or reduced-fat mayonnaise and salad dressings. Avocado. Olive, canola, sesame, or safflower oils. The items listed above may not be a complete list of foods and beverages you can eat. Contact a dietitian for more information. Foods to avoid Fruits  Canned fruit in heavy syrup. Fruit in cream or butter sauce. Fried fruit. Vegetables  Vegetables cooked in cheese, cream, or butter sauce. Fried vegetables. Grains  White bread. White pasta. White rice. Cornbread. Bagels, pastries, and croissants. Crackers and snack foods that contain trans fat and hydrogenated oils. Meats and other protein foods  Fatty cuts of meat. Ribs, chicken wings, bacon, sausage, bologna, salami, chitterlings, fatback, hot dogs, bratwurst, and packaged lunch meats. Liver and organ meats. Whole eggs and egg yolks. Chicken and turkey with skin. Fried meat. Dairy  Whole or 2% milk, cream, half-and-half, and cream cheese. Whole milk cheeses. Whole-fat or sweetened yogurt. Full-fat cheeses. Nondairy creamers and whipped toppings. Processed cheese, cheese spreads, and cheese curds. Beverages  Alcohol. Sugar-sweetened drinks such as sodas, lemonade, and fruit   drinks. Fats and oils  Butter, stick margarine, lard, shortening, ghee, or bacon fat. Coconut, palm kernel, and palm oils. Sweets and desserts  Corn syrup, sugars, honey, and molasses. Candy. Jam and jelly. Syrup. Sweetened cereals. Cookies, pies, cakes,  donuts, muffins, and ice cream. The items listed above may not be a complete list of foods and beverages you should avoid. Contact a dietitian for more information. Summary  Choosing the right foods helps keep your fat and cholesterol at normal levels. This can keep you from getting certain diseases.  At meals, fill one-half of your plate with vegetables and green salads.  Eat high-fiber foods, like whole grains, beans, apples, carrots, peas, and barley.  Limit added sugar, saturated fats, alcohol, and fried foods. This information is not intended to replace advice given to you by your health care provider. Make sure you discuss any questions you have with your health care provider. Document Revised: 07/28/2018 Document Reviewed: 08/11/2017 Elsevier Patient Education  2020 [employer]  Neuropathic Pain Neuropathic pain is pain caused by damage to the nerves that are responsible for certain sensations in your body (sensory nerves). The pain can be caused by:  Damage to the sensory nerves that send signals to your spinal cord and brain (peripheral nervous system).  Damage to the sensory nerves in your brain or spinal cord (central nervous system). Neuropathic pain can make you more sensitive to pain. Even a minor sensation can feel very painful. This is usually a long-term condition that can be difficult to treat. The type of pain differs from person to person. It may:  Start suddenly (acute), or it may develop slowly and last for a long time (chronic).  Come and go as damaged nerves heal, or it may stay at the same level for years.  Cause emotional distress, loss of sleep, and a lower quality of life. What are the causes? The most common cause of this condition is diabetes. Many other diseases and conditions can also cause neuropathic pain. Causes of neuropathic pain can be classified as:  Toxic. This is caused by medicines and chemicals. The most common cause of toxic neuropathic  pain is damage from cancer treatments (chemotherapy).  Metabolic. This can be caused by: ? Diabetes. This is the most common disease that damages the nerves. ? Lack of vitamin B from long-term alcohol abuse.  Traumatic. Any injury that cuts, crushes, or stretches a nerve can cause damage and pain. A common example is feeling pain after losing an arm or leg (phantom limb pain).  Compression-related. If a sensory nerve gets trapped or compressed for a long period of time, the blood supply to the nerve can be cut off.  Vascular. Many blood vessel diseases can cause neuropathic pain by decreasing blood supply and oxygen to nerves.  Autoimmune. This type of pain results from diseases in which the body's defense system (immune system) mistakenly attacks sensory nerves. Examples of autoimmune diseases that can cause neuropathic pain include lupus and multiple sclerosis.  Infectious. Many types of viral infections can damage sensory nerves and cause pain. Shingles infection is a common cause of this type of pain.  Inherited. Neuropathic pain can be a symptom of many diseases that are passed down through families (genetic). What increases the risk? You are more likely to develop this condition if:  You have diabetes.  You smoke.  You drink too much alcohol.  You are taking certain medicines, including medicines that kill cancer cells (chemotherapy) or that treat immune system  disorders. What are the signs or symptoms? The main symptom is pain. Neuropathic pain is often described as:  Burning.  Shock-like.  Stinging.  Hot or cold.  Itching. How is this diagnosed? No single test can diagnose neuropathic pain. It is diagnosed based on:  Physical exam and your symptoms. Your health care provider will ask you about your pain. You may be asked to use a pain scale to describe how bad your pain is.  Tests. These may be done to see if you have a high sensitivity to pain and to help find the  cause and location of any sensory nerve damage. They include: ? Nerve conduction studies to test how well nerve signals travel through your sensory nerves (electrodiagnostic testing). ? Stimulating your sensory nerves through electrodes on your skin and measuring the response in your spinal cord and brain (somatosensory evoked potential).  Imaging studies, such as: ? X-rays. ? CT scan. ? MRI. How is this treated? Treatment for neuropathic pain may change over time. You may need to try different treatment options or a combination of treatments. Some options include:  Treating the underlying cause of the neuropathy, such as diabetes, kidney disease, or vitamin deficiencies.  Stopping medicines that can cause neuropathy, such as chemotherapy.  Medicine to relieve pain. Medicines may include: ? Prescription or over-the-counter pain medicine. ? Anti-seizure medicine. ? Antidepressant medicines. ? Pain-relieving patches that are applied to painful areas of skin. ? A medicine to numb the area (local anesthetic), which can be injected as a nerve block.  Transcutaneous nerve stimulation. This uses electrical currents to block painful nerve signals. The treatment is painless.  Alternative treatments, such as: ? Acupuncture. ? Meditation. ? Massage. ? Physical therapy. ? Pain management programs. ? Counseling. Follow these instructions at home: Medicines   Take over-the-counter and prescription medicines only as told by your health care provider.  Do not drive or use heavy machinery while taking prescription pain medicine.  If you are taking prescription pain medicine, take actions to prevent or treat constipation. Your health care provider may recommend that you: ? Drink enough fluid to keep your urine pale yellow. ? Eat foods that are high in fiber, such as fresh fruits and vegetables, whole grains, and beans. ? Limit foods that are high in fat and processed sugars, such as fried or  sweet foods. ? Take an over-the-counter or prescription medicine for constipation. Lifestyle   Have a good support system at home.  Consider joining a chronic pain support group.  Do not use any products that contain nicotine or tobacco, such as cigarettes and e-cigarettes. If you need help quitting, ask your health care provider.  Do not drink alcohol. General instructions  Learn as much as you can about your condition.  Work closely with all your health care providers to find the treatment plan that works best for you.  Ask your health care provider what activities are safe for you.  Keep all follow-up visits as told by your health care provider. This is important. Contact a health care provider if:  Your pain treatments are not working.  You are having side effects from your medicines.  You are struggling with tiredness (fatigue), mood changes, depression, or anxiety. Summary  Neuropathic pain is pain caused by damage to the nerves that are responsible for certain sensations in your body (sensory nerves).  Neuropathic pain may come and go as damaged nerves heal, or it may stay at the same level for years.  Neuropathic  pain is usually a long-term condition that can be difficult to treat. Consider joining a chronic pain support group. This information is not intended to replace advice given to you by your health care provider. Make sure you discuss any questions you have with your health care provider. Document Revised: 03/17/2019 Document Reviewed: 12/11/2017 Elsevier Patient Education  2020 [employer].

## 2020-07-06 ENCOUNTER — Telehealth: Payer: Self-pay | Admitting: *Deleted

## 2020-07-06 LAB — CBC WITH DIFFERENTIAL/PLATELET
Basophils Absolute: 0 10*3/uL (ref 0.0–0.2)
Basos: 1 %
EOS (ABSOLUTE): 0.1 10*3/uL (ref 0.0–0.4)
Eos: 1 %
Hematocrit: 38.3 % (ref 37.5–51.0)
Hemoglobin: 12.2 g/dL — ABNORMAL LOW (ref 13.0–17.7)
Immature Grans (Abs): 0 10*3/uL (ref 0.0–0.1)
Immature Granulocytes: 0 %
Lymphocytes Absolute: 1.2 10*3/uL (ref 0.7–3.1)
Lymphs: 29 %
MCH: 22.5 pg — ABNORMAL LOW (ref 26.6–33.0)
MCHC: 31.9 g/dL (ref 31.5–35.7)
MCV: 71 fL — ABNORMAL LOW (ref 79–97)
Monocytes Absolute: 0.3 10*3/uL (ref 0.1–0.9)
Monocytes: 7 %
Neutrophils Absolute: 2.6 10*3/uL (ref 1.4–7.0)
Neutrophils: 62 %
Platelets: 254 10*3/uL (ref 150–450)
RBC: 5.42 x10E6/uL (ref 4.14–5.80)
RDW: 14.9 % (ref 11.6–15.4)
WBC: 4.2 10*3/uL (ref 3.4–10.8)

## 2020-07-06 LAB — IRON,TIBC AND FERRITIN PANEL
Ferritin: 122 ng/mL (ref 30–400)
Iron Saturation: 29 % (ref 15–55)
Iron: 77 ug/dL (ref 38–169)
Total Iron Binding Capacity: 269 ug/dL (ref 250–450)
UIBC: 192 ug/dL (ref 111–343)

## 2020-07-06 LAB — VITAMIN D 25 HYDROXY (VIT D DEFICIENCY, FRACTURES): Vit D, 25-Hydroxy: 37.1 ng/mL (ref 30.0–100.0)

## 2020-07-06 LAB — B12 AND FOLATE PANEL
Folate: >20 ng/mL (ref >3.0)
Vitamin B-12: 1001 pg/mL (ref 232–1245)

## 2020-07-06 NOTE — Telephone Encounter (Signed)
-----   Message from Berniece Pap, FNP sent at 07/06/2020 11:37 AM EDT ----- Patient's hemoglobin is 12.2, iron levels are within normal range, please check to see if patient is having any rectal or oral bleeding, I recommend that he follow-up with gastroenterology he has been referred previously and Maye Hides from referrals has been trying to reach him if you would let him know that. B12 is within normal limits. Vitamin D is within normal range, I recommend that he take vitamin D3 at 4000 international units by mouth daily.  Recheck vitamin D in around 6 months.  Recheck CBC with TIBC iron panel.  In 3 months, recommend GI evaluation and colonoscopy to rule out any rectal bleeding in stool or colon cancer.

## 2020-07-06 NOTE — Progress Notes (Signed)
Patient's hemoglobin is 12.2, iron levels are within normal range, please check to see if patient is having any rectal or oral bleeding, I recommend that he follow-up with gastroenterology he has been referred previously and Maye Hides from referrals has been trying to reach him if you would let him know that. B12 is within normal limits. Vitamin D is within normal range, I recommend that he take vitamin D3 at 4000 international units by mouth daily.  Recheck vitamin D in around 6 months.  Recheck CBC with TIBC iron panel.  In 3 months, recommend GI evaluation and colonoscopy to rule out any rectal bleeding in stool or colon cancer.

## 2020-07-06 NOTE — Telephone Encounter (Signed)
LMOVM for pt to return call. Okay for [facility] triage to give patient lab results.

## 2020-07-09 ENCOUNTER — Ambulatory Visit: Payer: BC Managed Care – PPO

## 2020-07-12 NOTE — Telephone Encounter (Signed)
LMOVM for pt to return call 

## 2020-07-13 ENCOUNTER — Other Ambulatory Visit: Payer: Self-pay

## 2020-07-13 ENCOUNTER — Ambulatory Visit
Admission: RE | Admit: 2020-07-13 | Discharge: 2020-07-13 | Disposition: A | Discharge status: Home / Self Care | Payer: BC Managed Care – PPO | Source: Ambulatory Visit | Attending: Adult Health | Admitting: Adult Health

## 2020-07-13 DIAGNOSIS — J0120 Acute ethmoidal sinusitis, unspecified: Secondary | ICD-10-CM | POA: Diagnosis not present

## 2020-07-13 DIAGNOSIS — J322 Chronic ethmoidal sinusitis: Secondary | ICD-10-CM | POA: Diagnosis not present

## 2020-07-13 DIAGNOSIS — G510 Bell's palsy: Secondary | ICD-10-CM | POA: Insufficient documentation

## 2020-07-13 DIAGNOSIS — J3489 Other specified disorders of nose and nasal sinuses: Secondary | ICD-10-CM | POA: Diagnosis not present

## 2020-07-31 DIAGNOSIS — Z8669 Personal history of other diseases of the nervous system and sense organs: Secondary | ICD-10-CM | POA: Diagnosis not present

## 2020-07-31 DIAGNOSIS — R200 Anesthesia of skin: Secondary | ICD-10-CM | POA: Diagnosis not present

## 2020-07-31 DIAGNOSIS — R202 Paresthesia of skin: Secondary | ICD-10-CM | POA: Diagnosis not present

## 2020-08-03 DIAGNOSIS — Z8669 Personal history of other diseases of the nervous system and sense organs: Secondary | ICD-10-CM | POA: Insufficient documentation

## 2020-09-26 ENCOUNTER — Other Ambulatory Visit: Payer: Self-pay | Admitting: Adult Health

## 2020-09-26 NOTE — Telephone Encounter (Signed)
Requested Prescriptions  Pending Prescriptions Disp Refills  . ASPIRIN ADULT LOW STRENGTH 81 MG EC tablet [Pharmacy Med Name: ASPIRIN EC 81 MG TABLET] 90 tablet 3    Sig: TAKE 1 TABLET (81 MG TOTAL) BY MOUTH DAILY. SWALLOW WHOLE.     Analgesics:  NSAIDS - aspirin Passed - 09/26/2020  1:14 AM      Passed - Patient is not pregnant      Passed - Valid encounter within last 12 months    Recent Outpatient Visits          2 months ago Vitamin D deficiency   [facility], Eula Fried, FNP   8 months ago Abnormal CBC   [facility], Eula Fried, FNP   9 months ago Bell's palsy   [employer], Eula Fried, FNP

## 2021-05-02 IMAGING — CT CT HEAD W/O CM
3 series · 15 of 47 positions shown, 18 images · non-contrast
Comparison: None.

CLINICAL DATA: 53 y.o. male presenting for evaluation of left
facial droop and left facial numbness. Reports this started
yesterday afternoon, but reports the numbness continued to progress
through the night last night. Denies any acute Sielatycki.*comment was
truncated*Focal neuro deficit, > 6 hrs, stroke suspected

EXAM:
CT HEAD WITHOUT CONTRAST
TECHNIQUE: Contiguous axial images were obtained from the base of the skull
through the vertex without intravenous contrast.

[Series 2: head wo · axial · 0.47mm/px · z∈[-166,-36]mm · 9 of 32 slices shown, 12 images]
[im 3/32  brain]
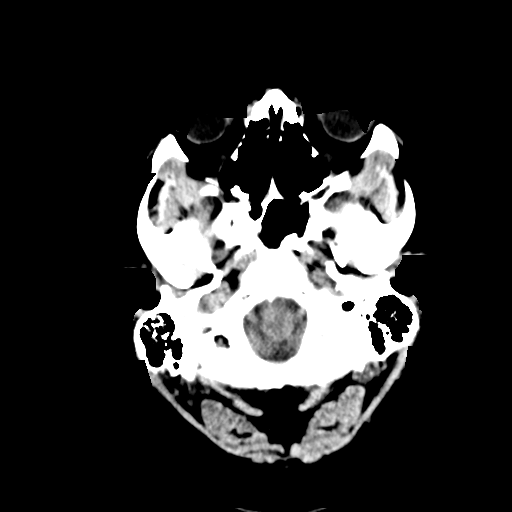
[im 3/32  bone]
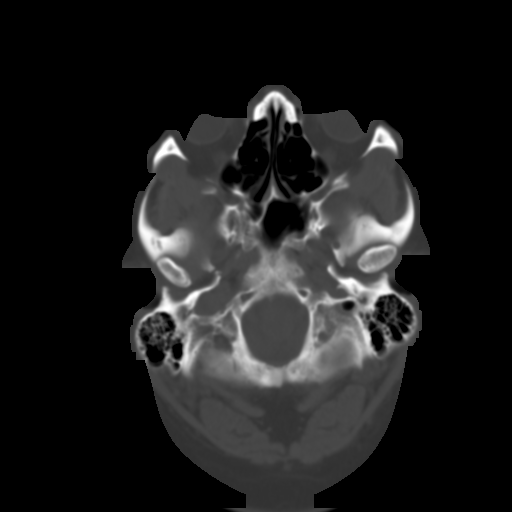
[im 6/32  brain]
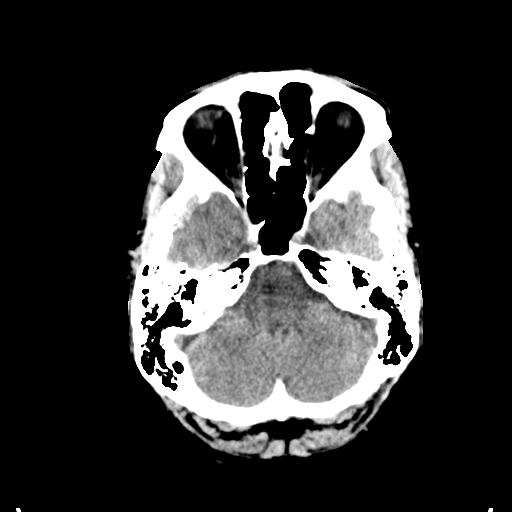
[im 9/32  brain]
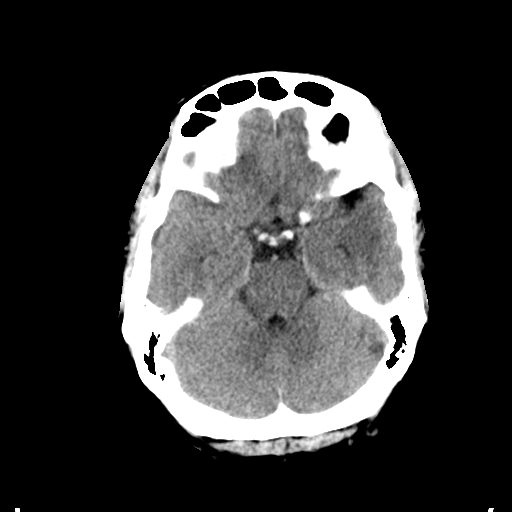
[im 12/32  brain]
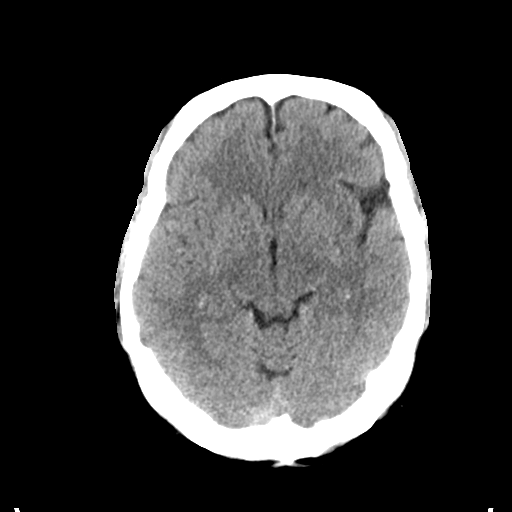
[im 17/32  brain]
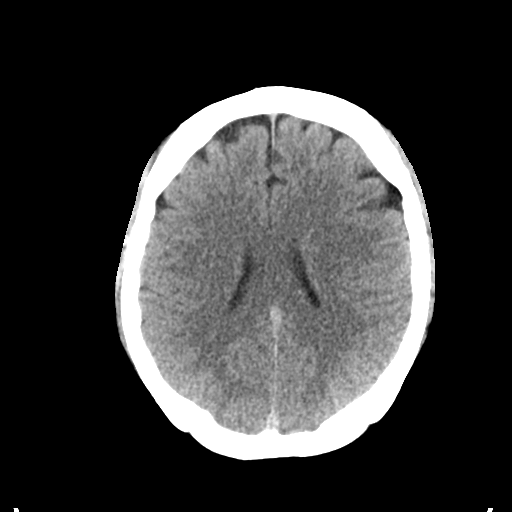
[im 17/32  bone]
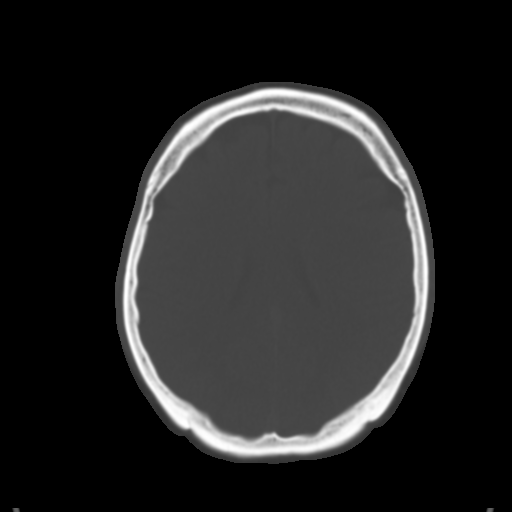
[im 20/32  brain]
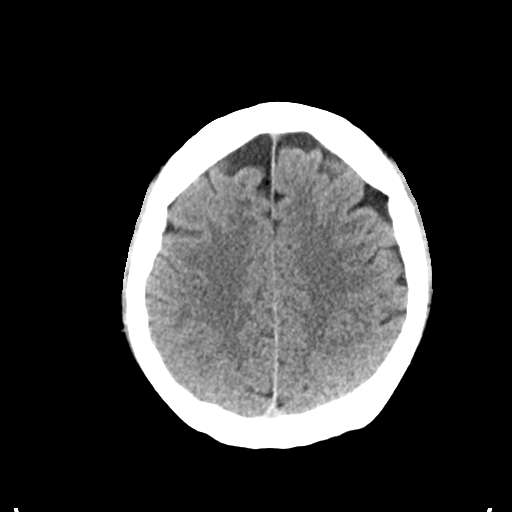
[im 23/32  brain]
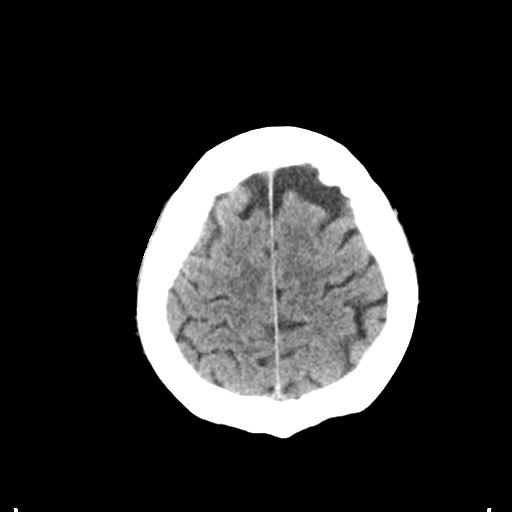
[im 26/32  brain]
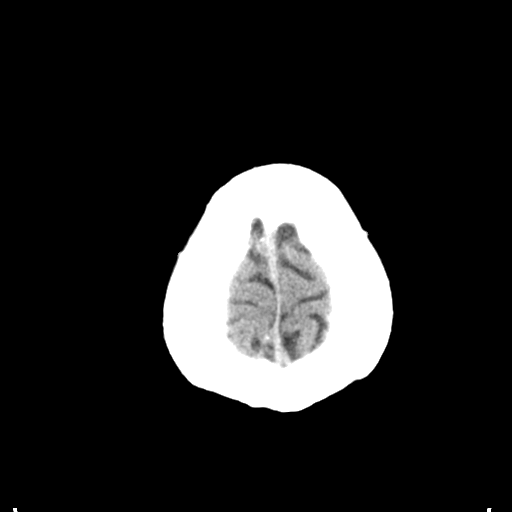
[im 29/32  brain]
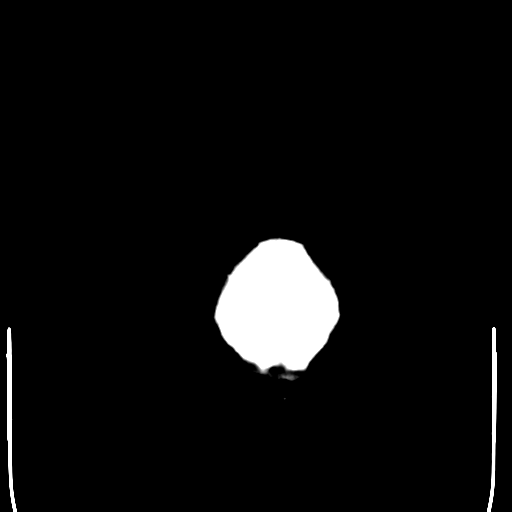
[im 29/32  bone]
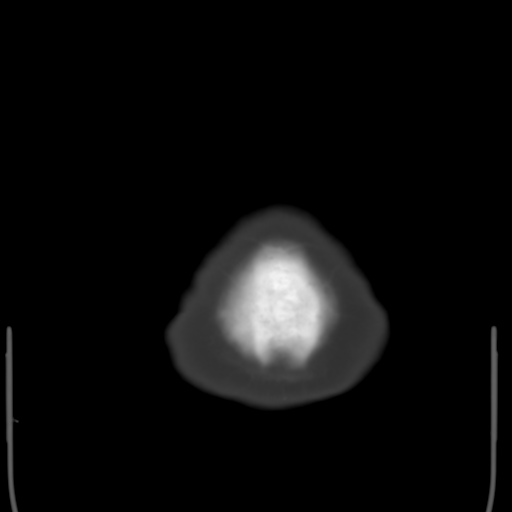

[Series 4: coronal soft tissue · coronal · 0.31mm/px · 3 of 67 slices shown]
[im 23/67  brain]
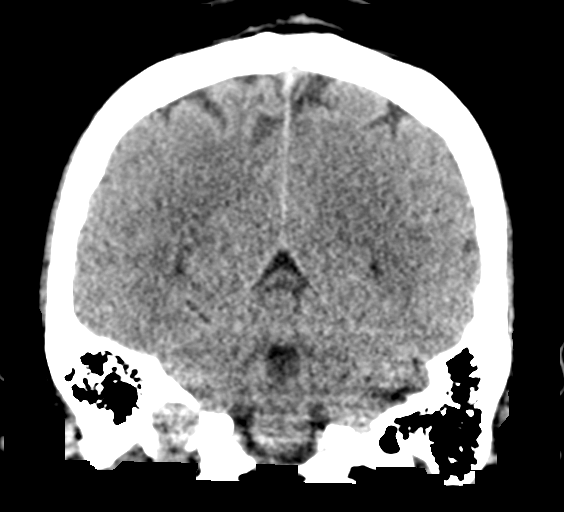
[im 30/67  brain]
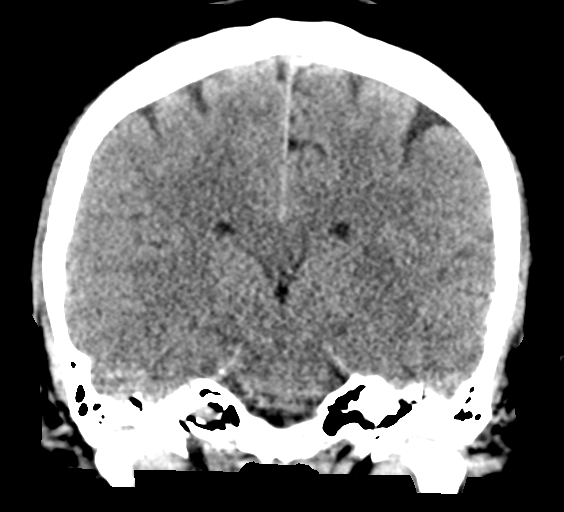
[im 37/67  brain]
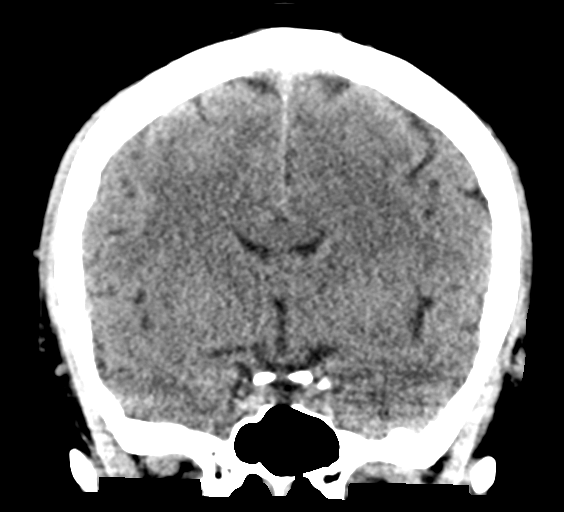

[Series 5: sagittal soft tissue · sagittal · 0.31mm/px · 3 of 58 slices shown]
[im 20/58  brain]
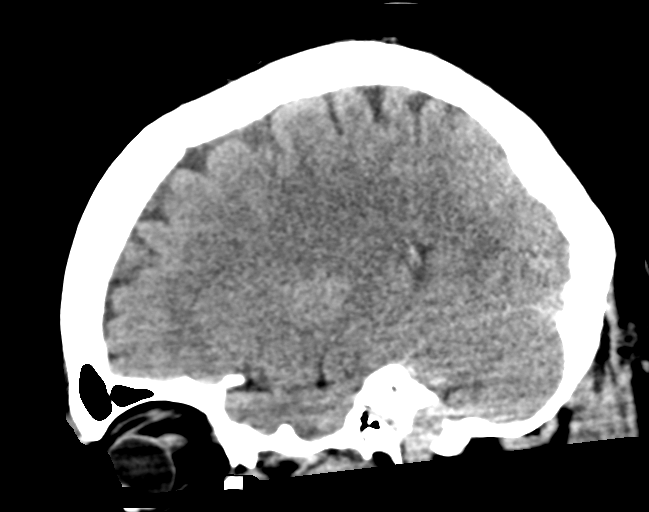
[im 29/58  brain]
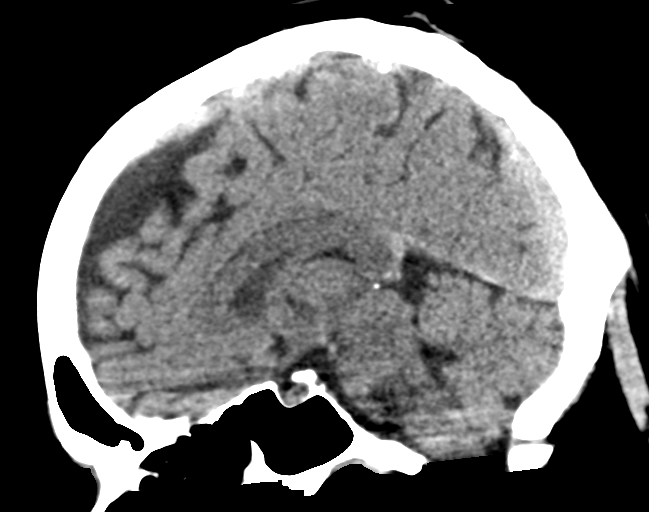
[im 39/58  brain]
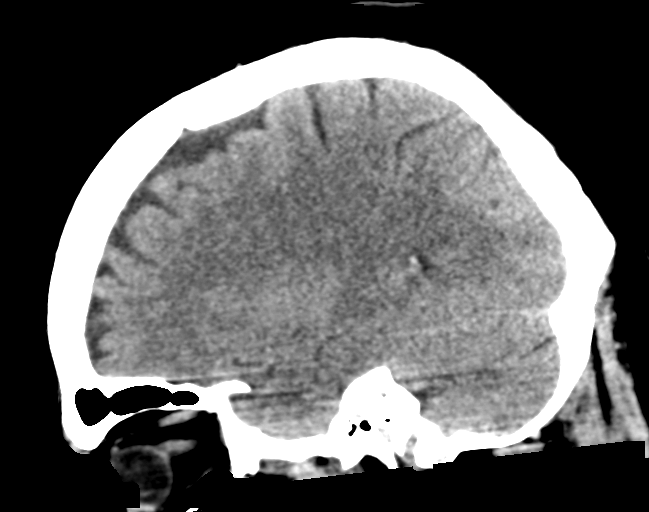

[15 of 47 positions shown; findings below may reference images not displayed]

FINDINGS: Brain: No acute intracranial hemorrhage. No focal mass lesion. No CT
evidence of acute infarction. No midline shift or mass effect. No
hydrocephalus. Basilar cisterns are patent.

Vascular: No hyperdense vessel or unexpected calcification.

Skull: Normal. Negative for fracture or focal lesion.

Sinuses/Orbits: Paranasal sinuses and mastoid air cells are clear.
Orbits are clear.

Other: None.
IMPRESSION: No CT evidence of acute infarction.  No acute intracranial findings.

## 2022-03-05 ENCOUNTER — Ambulatory Visit: Payer: BC Managed Care – PPO | Admitting: Podiatry

## 2022-04-24 DIAGNOSIS — L218 Other seborrheic dermatitis: Secondary | ICD-10-CM | POA: Diagnosis not present

## 2022-04-24 DIAGNOSIS — L821 Other seborrheic keratosis: Secondary | ICD-10-CM | POA: Diagnosis not present

## 2022-04-24 DIAGNOSIS — D171 Benign lipomatous neoplasm of skin and subcutaneous tissue of trunk: Secondary | ICD-10-CM | POA: Diagnosis not present

## 2023-01-19 DIAGNOSIS — L309 Dermatitis, unspecified: Secondary | ICD-10-CM | POA: Diagnosis not present

## 2023-04-20 DIAGNOSIS — L309 Dermatitis, unspecified: Secondary | ICD-10-CM | POA: Diagnosis not present

## 2023-04-20 DIAGNOSIS — L739 Follicular disorder, unspecified: Secondary | ICD-10-CM | POA: Diagnosis not present

## 2023-05-01 ENCOUNTER — Ambulatory Visit
Admission: RE | Admit: 2023-05-01 | Discharge: 2023-05-01 | Disposition: A | Discharge status: Home / Self Care | Payer: BC Managed Care – PPO | Source: Ambulatory Visit | Attending: Physician Assistant | Admitting: Physician Assistant

## 2023-05-01 ENCOUNTER — Other Ambulatory Visit: Payer: Self-pay | Admitting: Physician Assistant

## 2023-05-01 ENCOUNTER — Ambulatory Visit
Admission: RE | Admit: 2023-05-01 | Discharge: 2023-05-01 | Disposition: A | Discharge status: Home / Self Care | Payer: BC Managed Care – PPO | Attending: Neurosurgery | Admitting: Neurosurgery

## 2023-05-01 DIAGNOSIS — D869 Sarcoidosis, unspecified: Secondary | ICD-10-CM

## 2023-05-01 DIAGNOSIS — R918 Other nonspecific abnormal finding of lung field: Secondary | ICD-10-CM | POA: Diagnosis not present

## 2023-05-20 DIAGNOSIS — D869 Sarcoidosis, unspecified: Secondary | ICD-10-CM | POA: Diagnosis not present

## 2023-06-23 DIAGNOSIS — L309 Dermatitis, unspecified: Secondary | ICD-10-CM | POA: Diagnosis not present

## 2023-09-01 ENCOUNTER — Encounter: Payer: Self-pay | Admitting: Family Medicine

## 2023-09-01 ENCOUNTER — Ambulatory Visit: Payer: BC Managed Care – PPO | Admitting: Family Medicine

## 2023-09-01 VITALS — BP 135/88 | HR 67 | Temp 99.1°F | Ht 69.0 in | Wt 193.0 lb

## 2023-09-01 DIAGNOSIS — Z0000 Encounter for general adult medical examination without abnormal findings: Secondary | ICD-10-CM | POA: Diagnosis not present

## 2023-09-01 DIAGNOSIS — Z1159 Encounter for screening for other viral diseases: Secondary | ICD-10-CM | POA: Diagnosis not present

## 2023-09-01 DIAGNOSIS — K111 Hypertrophy of salivary gland: Secondary | ICD-10-CM

## 2023-09-01 DIAGNOSIS — E559 Vitamin D deficiency, unspecified: Secondary | ICD-10-CM | POA: Diagnosis not present

## 2023-09-01 DIAGNOSIS — Z114 Encounter for screening for human immunodeficiency virus [HIV]: Secondary | ICD-10-CM | POA: Diagnosis not present

## 2023-09-01 DIAGNOSIS — Z1211 Encounter for screening for malignant neoplasm of colon: Secondary | ICD-10-CM

## 2023-09-01 DIAGNOSIS — Z1212 Encounter for screening for malignant neoplasm of rectum: Secondary | ICD-10-CM

## 2023-09-01 DIAGNOSIS — D509 Iron deficiency anemia, unspecified: Secondary | ICD-10-CM | POA: Diagnosis not present

## 2023-09-01 NOTE — Patient Instructions (Addendum)
Please check with your insurance to see if they cover the Shingrix (shingles) vaccine given in clinic or if you need to get it at the pharmacy.  I strongly recommend you get this vaccine, followed by the second dose of it at least 4 weeks later.  Also ask about the Tdap vaccine (tetanus, diphtheria and pertussis) and you can get it here or should get it at the pharmacy.  You can use Debrox to soften the earwax in your ears.  There are also kits to flush out the earwax, the you should not do this to frequent.

## 2023-09-01 NOTE — Assessment & Plan Note (Addendum)
Patient has history of significant vitamin D deficiency with a lowest level of 9.7 on 12/04/2019.  Patient continues to take OTC vitamin D of unknown strength daily.  Will recheck his level today.

## 2023-09-01 NOTE — Assessment & Plan Note (Addendum)
Bilateral.  Patient declined further evaluation at this time.  Patient is not having any symptoms in relation to this. Advised patient to continue to monitor and contact the clinic if h he experiences any concerning symptoms.

## 2023-09-01 NOTE — Assessment & Plan Note (Signed)
Overall benign physical exam except for slightly enlarged parotid glands.  Patient denies any concerns regarding this and is not interested in having an ultrasound done to further evaluate at this time.

## 2023-09-01 NOTE — Progress Notes (Signed)
New patient visit   Patient: Roger Gamble   DOB: Sep 24, 1966   57 y.o. Male  MRN: [id number] Visit Date: 09/01/2023  Today's healthcare provider: Sherlyn Hay, DO   Chief Complaint  Patient presents with   Establish Care   Subjective    Roger Gamble is a 57 y.o. male who presents today as a new patient to establish care.  HPI  Fasting Feels health is good overall but that he has been gaining weight Exercise: not much. Does have a physically active/demanding job. Sleep: No trouble sleeping, sleeps six hours per night Diet: General; has been eating too much rice and potatoes lately. Does eat fast food daily.  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   History reviewed. No pertinent past medical history. Past Surgical History:  Procedure Laterality Date   NO PAST SURGERIES     Family Status  Relation Name Status   Mother  Alive   Father  Deceased  No partnership data on file   Family History  Problem Relation Age of Onset   Healthy Mother    Cirrhosis Father    Social History   Socioeconomic History   Marital status: Married    Spouse name: Not on file   Number of children: Not on file   Years of education: Not on file   Highest education level: Not on file  Occupational History   Not on file  Tobacco Use   Smoking status: Never   Smokeless tobacco: Never  Vaping Use   Vaping status: Never Used  Substance and Sexual Activity   Alcohol use: Not Currently   Drug use: Never   Sexual activity: Yes  Other Topics Concern   Not on file  Social History Narrative   Not on file   Social Determinants of Health   Financial Resource Strain: Not on file  Food Insecurity: Not on file  Transportation Needs: Not on file  Physical Activity: Not on file  Stress: Not on file  Social Connections: Not on file   Outpatient Medications Prior to Visit  Medication Sig    VITAMIN D, CHOLECALCIFEROL, PO Take 1 tablet by mouth daily.   [DISCONTINUED] ASPIRIN ADULT LOW STRENGTH 81 MG EC tablet TAKE 1 TABLET (81 MG TOTAL) BY MOUTH DAILY. SWALLOW WHOLE.   [DISCONTINUED] gabapentin (NEURONTIN) 300 MG capsule Take 1 capsule (300 mg total) by mouth 2 (two) times daily.   [DISCONTINUED] predniSONE (STERAPRED UNI-PAK 21 TAB) 10 MG (21) TBPK tablet PO: Take 6 tablets on day 1:Take 5 tablets day 2:Take 4 tablets day 3: Take 3 tablets day 4:Take 2 tablets day five: 5 Take 1 tablet day 6   No facility-administered medications prior to visit.   No Known Allergies   There is no immunization history on file for this patient.  Health Maintenance  Topic Date Due   HIV Screening  Never done   Hepatitis C Screening  Never done   DTaP/Tdap/Td (1 - Tdap) Never done   Colonoscopy  Never done   Zoster Vaccines- Shingrix (1 of 2) 09/08/2023 (Originally 09/13/2018)   INFLUENZA VACCINE  03/07/2024 (Originally 07/09/2023)   COVID-19 Vaccine (1 - 2023-24 season) 03/07/2024 (Originally 08/09/2023)   HPV VACCINES  Aged Out    Patient Care Team: Maziyah Vessel, Monico Blitz, DO as PCP - General (Family Medicine)  Review of Systems  Constitutional:  Negative for  appetite change, chills, fatigue and fever.  HENT:  Negative for congestion, ear pain, hearing loss, nosebleeds and trouble swallowing.   Eyes:  Negative for pain and visual disturbance.  Respiratory:  Negative for cough, chest tightness and shortness of breath.   Cardiovascular:  Negative for chest pain, palpitations and leg swelling.  Gastrointestinal:  Negative for abdominal pain, blood in stool, constipation, diarrhea, nausea and vomiting.  Endocrine: Negative for polydipsia, polyphagia and polyuria.  Genitourinary:  Negative for dysuria and flank pain.  Musculoskeletal:  Negative for arthralgias, back pain, joint swelling, myalgias and neck stiffness.  Skin:  Negative for color change, rash and wound.  Neurological:  Negative for  dizziness, tremors, seizures, speech difficulty, weakness, light-headedness and headaches.  Psychiatric/Behavioral:  Negative for behavioral problems, confusion, decreased concentration, dysphoric mood and sleep disturbance. The patient is not nervous/anxious.   All other systems reviewed and are negative.       Objective    BP 135/88   Pulse 67   Temp 99.1 F (37.3 C)   Ht 5\' 9"  (1.753 m)   Wt 193 lb (87.5 kg)   SpO2 98%   BMI 28.50 kg/m     Physical Exam Vitals and nursing note reviewed.  Constitutional:      General: He is awake.     Appearance: Normal appearance.  HENT:     Head: Normocephalic and atraumatic.     Jaw: No trismus, tenderness, swelling, pain on movement or malocclusion.     Salivary Glands: Right salivary gland is diffusely enlarged (parotid). Right salivary gland is not tender. Left salivary gland is diffusely enlarged (parotid). Left salivary gland is not tender.     Comments: Symmetrical parotid enlargement.    Right Ear: Tympanic membrane, ear canal and external ear normal. There is impacted cerumen.     Left Ear: Tympanic membrane, ear canal and external ear normal. There is impacted cerumen.     Nose: Nose normal.     Mouth/Throat:     Mouth: Mucous membranes are moist.     Pharynx: Oropharynx is clear. No oropharyngeal exudate or posterior oropharyngeal erythema.  Eyes:     General: No scleral icterus.    Extraocular Movements: Extraocular movements intact.     Conjunctiva/sclera: Conjunctivae normal.     Pupils: Pupils are equal, round, and reactive to light.  Neck:     Thyroid: No thyromegaly or thyroid tenderness.  Cardiovascular:     Rate and Rhythm: Normal rate and regular rhythm.     Pulses: Normal pulses.     Heart sounds: Normal heart sounds.  Pulmonary:     Effort: Pulmonary effort is normal. No tachypnea, bradypnea or respiratory distress.     Breath sounds: Normal breath sounds. No stridor. No wheezing, rhonchi or rales.   Abdominal:     General: Bowel sounds are normal. There is no distension.     Palpations: Abdomen is soft. There is no mass.     Tenderness: There is no abdominal tenderness. There is no guarding.     Hernia: No hernia is present.  Musculoskeletal:     Cervical back: Normal range of motion and neck supple.     Right lower leg: No edema.     Left lower leg: No edema.  Lymphadenopathy:     Cervical: No cervical adenopathy.  Skin:    General: Skin is warm and dry.  Neurological:     Mental Status: He is alert and oriented to person, place, and time.  Mental status is at baseline.  Psychiatric:        Mood and Affect: Mood normal.        Behavior: Behavior normal.     Depression Screen    09/01/2023    8:25 AM 12/06/2019   10:38 AM  PHQ 2/9 Scores  PHQ - 2 Score 0 0  PHQ- 9 Score  0   No results found for any visits on 09/01/23.  Assessment & Plan     Encounter for medical examination to establish care Assessment & Plan: Overall benign physical exam except for slightly enlarged parotid glands.  Patient denies any concerns regarding this and is not interested in having an ultrasound done to further evaluate at this time.  Orders: -     Microalbumin / creatinine urine ratio -     Comprehensive metabolic panel -     Lipid panel  Iron deficiency anemia, unspecified iron deficiency anemia type Assessment & Plan: Patient has significant history of iron deficiency anemia.  Will reevaluate iron levels and CBC today as he has not been seen in several years.  Orders: -     CBC -     Iron, TIBC and Ferritin Panel -     Ferritin  Parotid gland enlargement Assessment & Plan: Bilateral.  Patient declined further evaluation at this time.  Patient is not having any symptoms in relation to this. Advised patient to continue to monitor and contact the clinic if h he experiences any concerning symptoms.   Vitamin D deficiency Assessment & Plan: Patient has history of significant  vitamin D deficiency with a lowest level of 9.7 on 12/04/2019.  Patient continues to take OTC vitamin D of unknown strength daily.  Will recheck his level today.  Orders: -     VITAMIN D 25 Hydroxy (Vit-D Deficiency, Fractures)  Encounter for colorectal cancer screening -     Ambulatory referral to Gastroenterology  Encounter for screening for HIV -     HIV Antibody (routine testing w rflx)  Encounter for hepatitis C screening test for low risk patient -     HCV Ab w Reflex to Quant PCR    Return in about 1 year (around 08/31/2024) for CPE.     I discussed the assessment and treatment plan with the patient  The patient was provided an opportunity to ask questions and all were answered. The patient agreed with the plan and demonstrated an understanding of the instructions.   The patient was advised to call back or seek an in-person evaluation if the symptoms worsen or if the condition fails to improve as anticipated.    Sherlyn Hay, DO  [facility] [phone] (phone) [phone] (fax)  [facility]

## 2023-09-01 NOTE — Assessment & Plan Note (Signed)
Patient has significant history of iron deficiency anemia.  Will reevaluate iron levels and CBC today as he has not been seen in several years.

## 2023-09-02 LAB — COMPREHENSIVE METABOLIC PANEL
ALT: 20 IU/L (ref 0–44)
AST: 17 IU/L (ref 0–40)
Albumin: 4.5 g/dL (ref 3.8–4.9)
Alkaline Phosphatase: 75 IU/L (ref 44–121)
BUN/Creatinine Ratio: 16 (ref 9–20)
BUN: 12 mg/dL (ref 6–24)
Bilirubin Total: 0.4 mg/dL (ref 0.0–1.2)
CO2: 22 mmol/L (ref 20–29)
Calcium: 9.5 mg/dL (ref 8.7–10.2)
Chloride: 104 mmol/L (ref 96–106)
Creatinine, Ser: 0.73 mg/dL — ABNORMAL LOW (ref 0.76–1.27)
Globulin, Total: 2.3 g/dL (ref 1.5–4.5)
Glucose: 101 mg/dL — ABNORMAL HIGH (ref 70–99)
Potassium: 4.5 mmol/L (ref 3.5–5.2)
Sodium: 137 mmol/L (ref 134–144)
Total Protein: 6.8 g/dL (ref 6.0–8.5)
eGFR: 108 mL/min/{1.73_m2} (ref >59)

## 2023-09-02 LAB — LIPID PANEL
Chol/HDL Ratio: 5.7 ratio — ABNORMAL HIGH (ref 0.0–5.0)
Cholesterol, Total: 217 mg/dL — ABNORMAL HIGH (ref 100–199)
HDL: 38 mg/dL — ABNORMAL LOW (ref >39)
LDL Chol Calc (NIH): 163 mg/dL — ABNORMAL HIGH (ref 0–99)
Triglycerides: 88 mg/dL (ref 0–149)
VLDL Cholesterol Cal: 16 mg/dL (ref 5–40)

## 2023-09-02 LAB — HCV INTERPRETATION

## 2023-09-02 LAB — IRON,TIBC AND FERRITIN PANEL
Ferritin: 180 ng/mL (ref 30–400)
Iron Saturation: 36 % (ref 15–55)
Iron: 103 ug/dL (ref 38–169)
Total Iron Binding Capacity: 289 ug/dL (ref 250–450)
UIBC: 186 ug/dL (ref 111–343)

## 2023-09-02 LAB — CBC
Hematocrit: 42.4 % (ref 37.5–51.0)
Hemoglobin: 13.5 g/dL (ref 13.0–17.7)
MCH: 23 pg — ABNORMAL LOW (ref 26.6–33.0)
MCHC: 31.8 g/dL (ref 31.5–35.7)
MCV: 72 fL — ABNORMAL LOW (ref 79–97)
Platelets: 285 10*3/uL (ref 150–450)
RBC: 5.87 x10E6/uL — ABNORMAL HIGH (ref 4.14–5.80)
RDW: 14.9 % (ref 11.6–15.4)
WBC: 4.2 10*3/uL (ref 3.4–10.8)

## 2023-09-02 LAB — VITAMIN D 25 HYDROXY (VIT D DEFICIENCY, FRACTURES): Vit D, 25-Hydroxy: 30.2 ng/mL (ref 30.0–100.0)

## 2023-09-02 LAB — HCV AB W REFLEX TO QUANT PCR: HCV Ab: NONREACTIVE

## 2023-09-02 LAB — MICROALBUMIN / CREATININE URINE RATIO
Creatinine, Urine: 34 mg/dL
Microalb/Creat Ratio: <9 mg/g creat (ref 0–29)
Microalbumin, Urine: <3 ug/mL

## 2023-09-02 LAB — HIV ANTIBODY (ROUTINE TESTING W REFLEX): HIV Screen 4th Generation wRfx: NONREACTIVE

## 2023-09-10 ENCOUNTER — Ambulatory Visit: Payer: BC Managed Care – PPO | Admitting: Family Medicine

## 2023-09-14 ENCOUNTER — Encounter: Payer: Self-pay | Admitting: *Deleted

## 2023-10-06 ENCOUNTER — Telehealth: Payer: Self-pay

## 2023-10-06 NOTE — Telephone Encounter (Signed)
Patient called and left a voicemail wanting to schedule colonoscopy I called patient back to let him know we received his message, and the message has been sent to the scheduler.

## 2023-10-07 NOTE — Telephone Encounter (Signed)
Message left for patient to return my call this morning.

## 2023-10-07 NOTE — Telephone Encounter (Signed)
Spoken to patient's wife [facility]) and inform her that no one is his patient's DPR.  Inform her that she can have patient's give a verbal consent for this time so she can schedule his colonoscopy since patient work until 6pm.
